# Patient Record
Sex: Male | Born: 1965 | Race: White | Hispanic: No | Marital: Married | Smoking: Current every day smoker
Health system: Southern US, Community
[De-identification: ages and names within clinical notes are randomized; demographics above are authoritative.]

## PROBLEM LIST (undated history)

## (undated) DIAGNOSIS — F17200 Nicotine dependence, unspecified, uncomplicated: Secondary | ICD-10-CM

## (undated) DIAGNOSIS — K219 Gastro-esophageal reflux disease without esophagitis: Secondary | ICD-10-CM

## (undated) DIAGNOSIS — E785 Hyperlipidemia, unspecified: Secondary | ICD-10-CM

## (undated) DIAGNOSIS — T7840XA Allergy, unspecified, initial encounter: Secondary | ICD-10-CM

## (undated) HISTORY — PX: KNEE SURGERY: SHX244

## (undated) HISTORY — PX: EYE SURGERY: SHX253

## (undated) HISTORY — DX: Allergy, unspecified, initial encounter: T78.40XA

## (undated) HISTORY — DX: Hyperlipidemia, unspecified: E78.5

## (undated) HISTORY — DX: Gastro-esophageal reflux disease without esophagitis: K21.9

## (undated) HISTORY — DX: Nicotine dependence, unspecified, uncomplicated: F17.200

---

## 1998-09-14 ENCOUNTER — Ambulatory Visit (HOSPITAL_BASED_OUTPATIENT_CLINIC_OR_DEPARTMENT_OTHER): Admission: RE | Admit: 1998-09-14 | Discharge: 1998-09-14 | Payer: Self-pay | Admitting: Urology

## 1999-10-05 ENCOUNTER — Emergency Department (HOSPITAL_COMMUNITY): Admission: EM | Admit: 1999-10-05 | Discharge: 1999-10-05 | Payer: Self-pay | Admitting: Emergency Medicine

## 1999-10-05 ENCOUNTER — Encounter: Payer: Self-pay | Admitting: Emergency Medicine

## 2004-03-29 ENCOUNTER — Ambulatory Visit (HOSPITAL_COMMUNITY): Admission: RE | Admit: 2004-03-29 | Discharge: 2004-03-29 | Payer: Self-pay | Admitting: General Surgery

## 2004-11-13 ENCOUNTER — Emergency Department (HOSPITAL_COMMUNITY): Admission: EM | Admit: 2004-11-13 | Discharge: 2004-11-13 | Payer: Self-pay | Admitting: Emergency Medicine

## 2006-03-23 IMAGING — CR DG CHEST 2V
2 series · 2 of 2 positions shown · non-contrast
Comparison: none

CLINICAL DATA: 39 year-old male with chest pain
 [id number] VIEWS:

[view not recorded (1 of 2)]
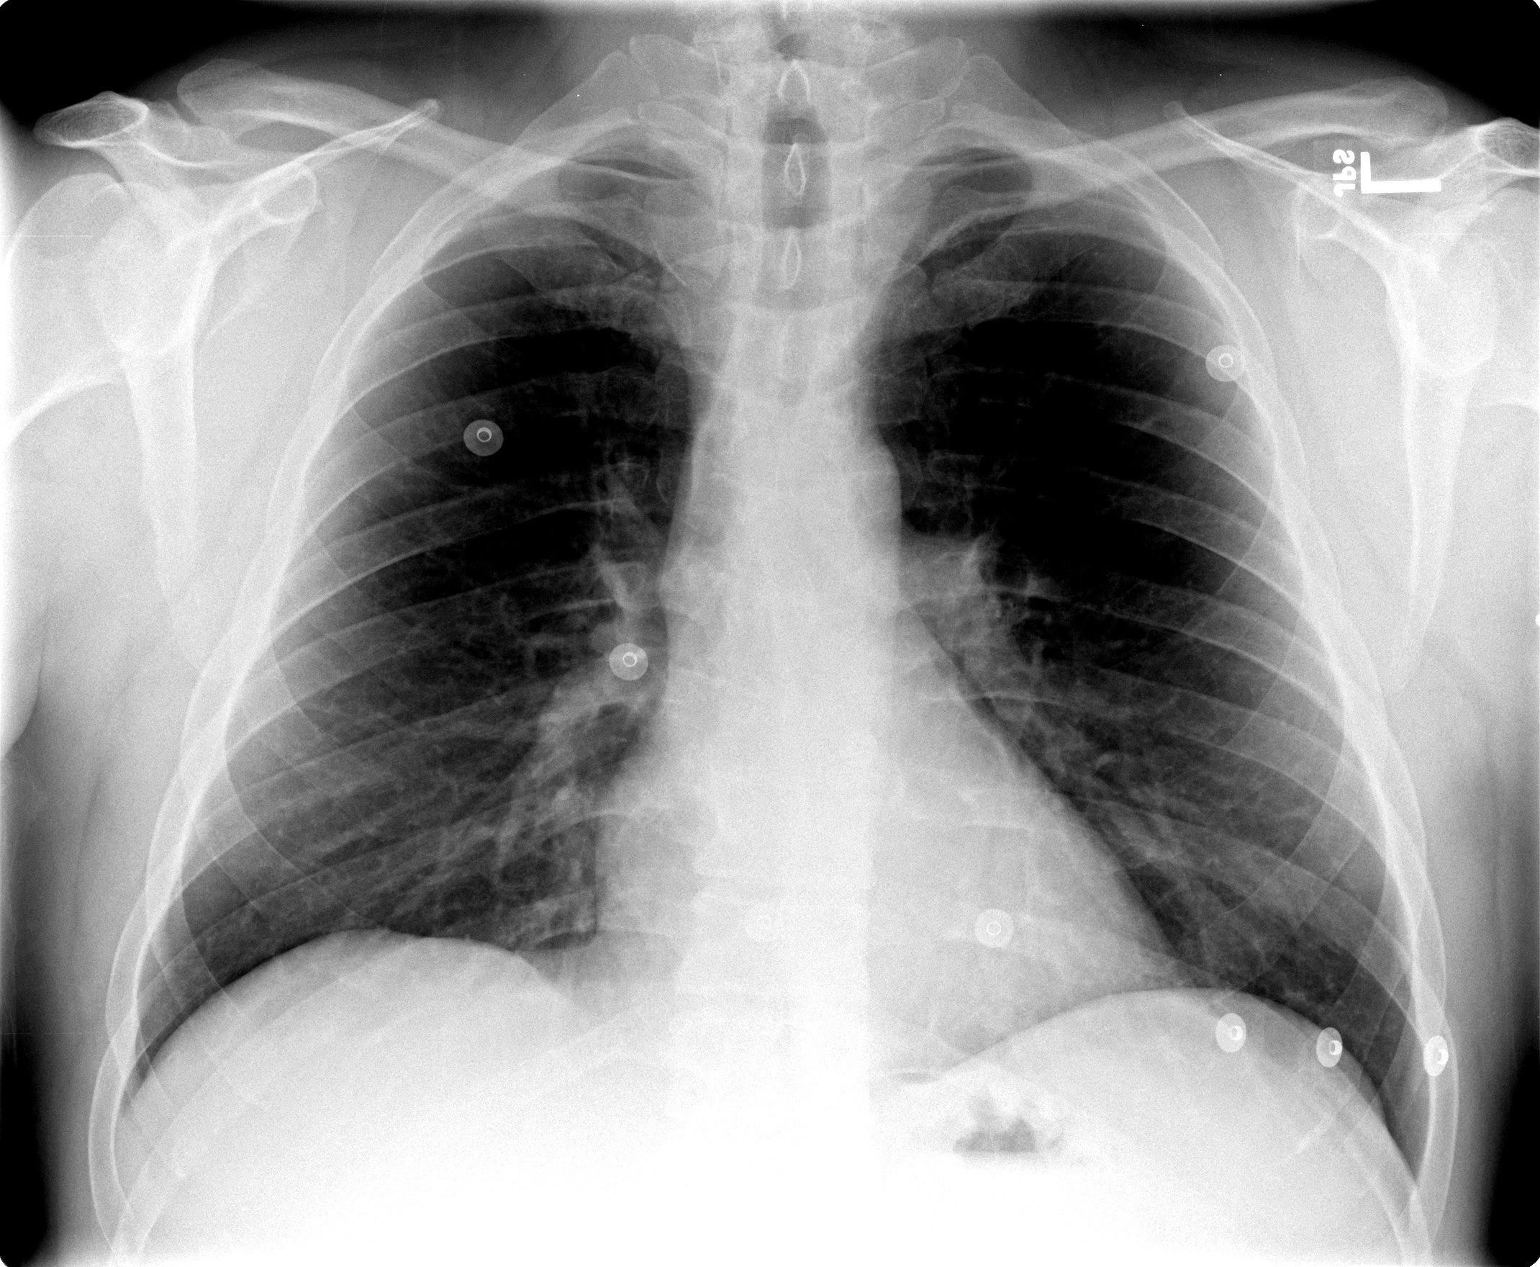

[view not recorded (2 of 2)]
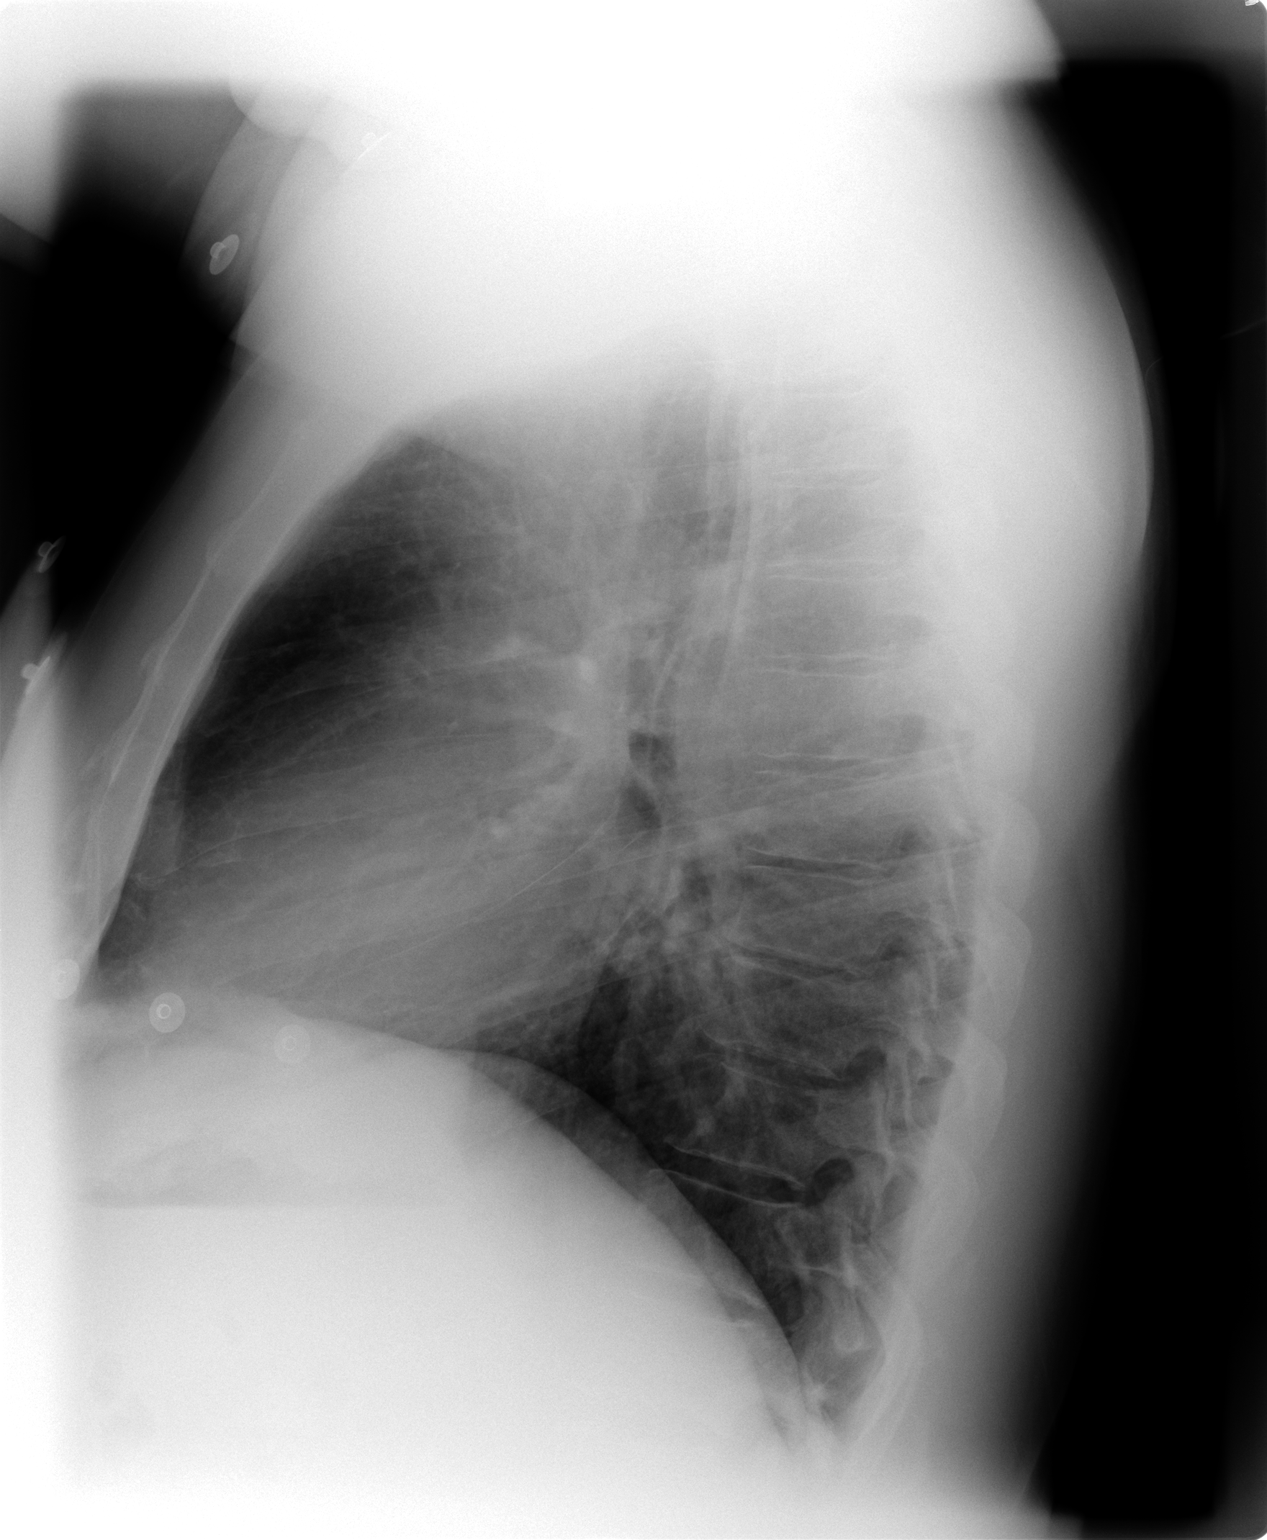

[2 of 2 positions shown; findings below may reference images not displayed]

FINDINGS: Lungs are clear.  No acute pneumonia, consolidation, effusion, or pneumothorax.  Heart size is normal.
IMPRESSION: No active chest disease.

## 2007-06-18 ENCOUNTER — Ambulatory Visit: Payer: Self-pay | Admitting: Family Medicine

## 2007-09-27 ENCOUNTER — Ambulatory Visit: Payer: Self-pay | Admitting: Family Medicine

## 2007-09-29 ENCOUNTER — Ambulatory Visit: Payer: Self-pay | Admitting: Family Medicine

## 2007-10-01 ENCOUNTER — Ambulatory Visit: Payer: Self-pay | Admitting: Family Medicine

## 2010-03-01 ENCOUNTER — Ambulatory Visit: Payer: Self-pay | Admitting: Family Medicine

## 2010-12-05 ENCOUNTER — Ambulatory Visit: Payer: Self-pay | Admitting: Family Medicine

## 2011-01-10 NOTE — Op Note (Signed)
NAME:  Dennis Fox, Dennis Fox                          ACCOUNT NO.:  [id number]   MEDICAL RECORD NO.:  [id number]                   PATIENT TYPE:  AMB   LOCATION:  DAY                                  FACILITY:  [facility]   PHYSICIAN:  Timothy E. Earlene Plater, M.D.              DATE OF BIRTH:  10/04/1965   DATE OF PROCEDURE:  03/29/2004  DATE OF DISCHARGE:                                 OPERATIVE REPORT   PREOPERATIVE DIAGNOSIS:  Umbilical hernia.   POSTOPERATIVE DIAGNOSIS:  Incarcerated umbilical hernia.   OPERATIVE PROCEDURE:  Repair of hernia with mesh.   SURGEON:  Timothy E. Earlene Plater, M.D.   ANESTHESIA:  General.   Mr. Macaraeg is otherwise healthy, works strenuously, has an increasingly  large and painful umbilical hernia, and he wishes to have a repair.  This  has been carefully discussed.  He was seen, identified, and the permit  signed.   The patient taken to the operating room, placed supine, general endotracheal  anesthesia administered.  The abdomen had been shaved, scrubbed, it was now  prepped and draped.  Marcaine 0.25% with epinephrine used throughout.  A  curvilinear incision made in the infraumbilical fold, the umbilical skin  carefully dissected superiorly, and the incarcerated contents of the hernia  dissected off the skin.  Retractors applied.  The hernia appeared to be only  preperitoneal fat, but it was well adherent to the skin and the lateral  fascia of the 2 cm hernia defect.  The preperitoneal fat was carefully  dissected from the skin, the sidewalls of the fascia, above and below the  fascia.  A modified plug was made shorter than usual height and sutured into  the defect, and then a patch was placed over this and sutured to the  surrounding fascia.  This completed the repair.  There was no bleeding or  complication.  The umbilical skin was carefully treated and attached to the  fascia, subcu stitches of 3-0 Vicryl placed, and subcuticular stitches of 3-  0 Monocryl.   Counts correct.  Steri-Strips, dry sterile dressings applied.  He tolerated it well.   Written and verbal instructions given to him and his wife, and he will be  seen and followed as an outpatient.                                               Timothy E. Earlene Plater, M.D.    TED/MEDQ  D:  03/29/2004  T:  03/30/2004  Job:  [id number]

## 2011-05-27 ENCOUNTER — Ambulatory Visit (INDEPENDENT_AMBULATORY_CARE_PROVIDER_SITE_OTHER): Payer: 59 | Admitting: Medical

## 2011-05-27 ENCOUNTER — Encounter: Payer: Self-pay | Admitting: Medical

## 2011-05-27 VITALS — BP 140/80 | HR 68 | Temp 98.1°F | Resp 18 | Ht 67.0 in | Wt 222.0 lb

## 2011-05-27 DIAGNOSIS — J40 Bronchitis, not specified as acute or chronic: Secondary | ICD-10-CM

## 2011-05-27 DIAGNOSIS — J329 Chronic sinusitis, unspecified: Secondary | ICD-10-CM

## 2011-05-27 DIAGNOSIS — F17200 Nicotine dependence, unspecified, uncomplicated: Secondary | ICD-10-CM | POA: Insufficient documentation

## 2011-05-27 MED ORDER — DOXYCYCLINE HYCLATE 100 MG PO TABS: 100.0000 mg | ORAL_TABLET | Freq: Two times a day (BID) | ORAL | Status: AC

## 2011-05-27 MED ORDER — BENZONATATE 100 MG PO CAPS: 100.0000 mg | ORAL_CAPSULE | Freq: Four times a day (QID) | ORAL | Status: DC | PRN

## 2011-05-27 NOTE — Progress Notes (Signed)
Subjective:     Dennis Fox is a 45 y.o. male who presents for 6 day hx/o cough, sinus pressure, chest congestion, bright green sinus drainage, fatigue, and not feeling well.  Usually is not sick.  Been using Coricidin OTC, with some relief. Denies sick contacts.  No other aggravating or relieving factors.  No other c/o.  He is a 1.5 ppd smoker, and has failed Chantix prior.  The following portions of the patient's history were reviewed and updated as appropriate: allergies, current medications, past family history, past medical history, past social history, past surgical history and problem list.  Past Medical History  Diagnosis Date  . Allergy   . GERD (gastroesophageal reflux disease)   . Dyslipidemia   . Tobacco use disorder    Review of Systems Constitutional: +chills; denies fever, sweats, anorexia Skin: denies rash HEENT: +sore throat; denies ear pain, itchy watery eyes Cardiovascular: denies chest pain, palpitations Lungs: +mild SOB, +productive sputum; denies wheezing, hemoptysis, orthopnea, PND Abdomen: denies abdominal pain, nausea, vomiting, diarrhea GU: denies dysuria Extremities: denies edema, myalgias, arthralgias  Objective:   Filed Vitals:   05/27/11 1604  BP: 140/80  Pulse: 68  Temp: 98.1 F (36.7 C)  Resp: 18    General appearance: Alert, WD/WN, no distress, ill appearing                             Skin: warm, no rash, no diaphoresis                           Head: mild sinus tenderness                            Eyes: conjunctiva normal, corneas clear, PERRLA                            Ears: pearly TMs, external ear canals normal                          Nose: septum midline, turbinates swollen, with erythema and clear discharge             Mouth/throat: MMM, tongue normal, moderate pharyngeal erythema                           Neck: supple, no adenopathy, no thyromegaly, nontender                          Heart: RRR, normal S1, S2, no murmurs                     Lungs: +bronchial breath sounds, +scattered rhonchi, +few wheezes, no rales                Extremities: no edema, nontender     Assessment:   Encounter Diagnoses  Name Primary?  . Bronchitis Yes  . Tobacco use disorder   . Sinusitis     Plan:   Prescription given today for Doxycycline and Tessalon Perles as below.  Discussed diagnosis and treatment of bronchitis.  Suggested symptomatic OTC remedies for cough and congestion.  Nasal saline spray for nasal congestion.  Tylenol or Ibuprofen OTC for fever and malaise.  Call/return in 2-3 days if  symptoms are worse or not improving.  Advised that cough may linger even after the infection is improved.     Discussed risks of tobacco use disorder, advised he call 1-800-QUIT-NOW to help with smoking cessation.

## 2012-01-12 ENCOUNTER — Encounter: Payer: Self-pay | Admitting: Medical

## 2012-01-12 ENCOUNTER — Ambulatory Visit (INDEPENDENT_AMBULATORY_CARE_PROVIDER_SITE_OTHER): Payer: 59 | Admitting: Medical

## 2012-01-12 VITALS — BP 120/80 | HR 68 | Temp 97.6°F | Resp 16 | Wt 223.0 lb

## 2012-01-12 DIAGNOSIS — B86 Scabies: Secondary | ICD-10-CM

## 2012-01-12 MED ORDER — HYDROXYZINE HCL 25 MG PO TABS: 25.0000 mg | ORAL_TABLET | Freq: Three times a day (TID) | ORAL | Status: AC | PRN

## 2012-01-12 MED ORDER — PERMETHRIN 5 % EX CREA: TOPICAL_CREAM | CUTANEOUS | Status: DC

## 2012-01-12 NOTE — Progress Notes (Signed)
Subjective: Here for possible scabies.  [place].  His daughter took the cat in, and since then she began having rash and breaking out.  They took her to the dermatologist who diagnosed her with scabies.  She was prescribed a topical cream she kept on overnight as well as a pill.  Her rash cleared up but now he and his wife both have similar itchy rash on legs and lower extremities.  He is taking nothing for symptoms but reports bites on legs, waistline and back of legs in the folds of the knees.    Objective: Gen: wd, wn, nad Skin: scattered small 1mm papules with some surrounding erythema mildly on upper inguinal/pubic region as well as bilat upper and lower legs and popliteal regions.  Assessment and plan Encounter Diagnosis  Name Primary?  . Scabies Yes   Scripts for permethrin and hydroxyzine as below.  Call if not clearing in 1 wk.  Clean the bed sheets and all household contacts should be treated.  The cat has been put outside already.

## 2012-02-17 ENCOUNTER — Encounter: Payer: Self-pay | Admitting: Internal Medicine

## 2012-02-20 ENCOUNTER — Ambulatory Visit (INDEPENDENT_AMBULATORY_CARE_PROVIDER_SITE_OTHER): Payer: 59 | Admitting: Family Medicine

## 2012-02-20 ENCOUNTER — Encounter: Payer: Self-pay | Admitting: Family Medicine

## 2012-02-20 VITALS — BP 104/60 | HR 60 | Ht 68.0 in | Wt 223.0 lb

## 2012-02-20 DIAGNOSIS — Z0000 Encounter for general adult medical examination without abnormal findings: Secondary | ICD-10-CM

## 2012-02-20 DIAGNOSIS — E785 Hyperlipidemia, unspecified: Secondary | ICD-10-CM

## 2012-02-20 DIAGNOSIS — J309 Allergic rhinitis, unspecified: Secondary | ICD-10-CM

## 2012-02-20 DIAGNOSIS — J302 Other seasonal allergic rhinitis: Secondary | ICD-10-CM | POA: Insufficient documentation

## 2012-02-20 DIAGNOSIS — K219 Gastro-esophageal reflux disease without esophagitis: Secondary | ICD-10-CM | POA: Insufficient documentation

## 2012-02-20 LAB — HEMOCCULT GUIAC POC 1CARD (OFFICE)

## 2012-02-20 LAB — COMPREHENSIVE METABOLIC PANEL
ALT: 26 U/L (ref 0–53)
AST: 23 U/L (ref 0–37)
Albumin: 4.3 g/dL (ref 3.5–5.2)
Alkaline Phosphatase: 60 U/L (ref 39–117)
BUN: 20 mg/dL (ref 6–23)
CO2: 23 mEq/L (ref 19–32)
Calcium: 9.5 mg/dL (ref 8.4–10.5)
Chloride: 106 mEq/L (ref 96–112)
Creat: 1.27 mg/dL (ref 0.50–1.35)
Glucose, Bld: 103 mg/dL — ABNORMAL HIGH (ref 70–99)
Potassium: 4.5 mEq/L (ref 3.5–5.3)
Sodium: 137 mEq/L (ref 135–145)
Total Bilirubin: 0.8 mg/dL (ref 0.3–1.2)
Total Protein: 7 g/dL (ref 6.0–8.3)

## 2012-02-20 LAB — LIPID PANEL
Cholesterol: 212 mg/dL — ABNORMAL HIGH (ref 0–200)
HDL: 43 mg/dL (ref >39)
LDL Cholesterol: 125 mg/dL — ABNORMAL HIGH (ref 0–99)
Total CHOL/HDL Ratio: 4.9 Ratio
Triglycerides: 221 mg/dL — ABNORMAL HIGH (ref <150)
VLDL: 44 mg/dL — ABNORMAL HIGH (ref 0–40)

## 2012-02-20 NOTE — Progress Notes (Signed)
  Subjective:    Patient ID: Dennis Fox, male    DOB: 08-22-1966, 46 y.o.   MRN: [id number]  HPI He is here for complete examination. He has no particular concerns. His allergies seem to be under good control. He does have reflux disease and uses TUMS to help with this. He has a previous history of hyperlipidemia. Family history significant for both parents having bypass grafting done in their 30s. His social and family history were reviewed. He does have 2 stepchildren ages 66 and 88. His marriage seems to be going well. Work keeps him busy. He recently quit smoking.   Review of Systems  Constitutional: Negative.   HENT: Negative.   Eyes: Negative.   Respiratory: Negative.   Cardiovascular: Negative.   Gastrointestinal: Negative.   Genitourinary: Negative.   Musculoskeletal: Negative.   Skin: Negative.   Neurological: Negative.   Hematological: Negative.   Psychiatric/Behavioral: Negative.        Objective:   Physical Exam BP 104/60  Pulse 60  Ht 5\' 8"  (1.727 m)  Wt 223 lb (101.152 kg)  BMI 33.91 kg/m2  SpO2 98%  General Appearance:    Alert, cooperative, no distress, appears stated age  Head:    Normocephalic, without obvious abnormality, atraumatic  Eyes:    PERRL, conjunctiva/corneas clear, EOM's intact, fundi    benign  Ears:    Normal TM's and external ear canals  Nose:   Nares normal, mucosa normal, no drainage or sinus   tenderness  Throat:   Lips, mucosa, and tongue normal; teeth and gums normal  Neck:   Supple, no lymphadenopathy;  thyroid:  no   enlargement/tenderness/nodules; no carotid   bruit or JVD  Back:    Spine nontender, no curvature, ROM normal, no CVA     tenderness  Lungs:     Clear to auscultation bilaterally without wheezes, rales or     ronchi; respirations unlabored  Chest Wall:    No tenderness or deformity   Heart:    Regular rate and rhythm, S1 and S2 normal, no murmur, rub   or gallop  Breast Exam:    No chest wall tenderness, masses or  gynecomastia  Abdomen:     Soft, non-tender, nondistended, normoactive bowel sounds,    no masses, no hepatosplenomegaly  Genitalia:    Normal male external genitalia without lesions.  Testicles without masses.  No inguinal hernias.  Rectal:    Normal sphincter tone, no masses or tenderness; guaiac negative stool.  Prostate smooth, no nodules, not enlarged.  Extremities:   No clubbing, cyanosis or edema  Pulses:   2+ and symmetric all extremities  Skin:   Skin color, texture, turgor normal, no rashes or lesions  Lymph nodes:   Cervical, supraclavicular, and axillary nodes normal  Neurologic:   CNII-XII intact, normal strength, sensation and gait; reflexes 2+ and symmetric throughout          Psych:   Normal mood, affect, hygiene and grooming.           Assessment & Plan:   1. Routine general medical examination at a health care facility  CBC with Differential, Comprehensive metabolic panel, Lipid panel, Hemoccult - 1 Card (office)  2. Allergic rhinitis, seasonal    3. GERD (gastroesophageal reflux disease)    4. Hyperlipidemia LDL goal <70  Lipid panel

## 2012-02-20 NOTE — Patient Instructions (Signed)
Congratulations on quitting smoking. If you run into trouble, don't hesitate to call.

## 2012-02-21 LAB — CBC WITH DIFFERENTIAL/PLATELET
Basophils Absolute: 0 10*3/uL (ref 0.0–0.1)
Basophils Relative: 1 % (ref 0–1)
Eosinophils Absolute: 0.3 10*3/uL (ref 0.0–0.7)
Eosinophils Relative: 5 % (ref 0–5)
HCT: 40.4 % (ref 39.0–52.0)
Hemoglobin: 14.2 g/dL (ref 13.0–17.0)
Lymphocytes Relative: 26 % (ref 12–46)
Lymphs Abs: 1.7 10*3/uL (ref 0.7–4.0)
MCH: 31.8 pg (ref 26.0–34.0)
MCHC: 35.1 g/dL (ref 30.0–36.0)
MCV: 90.4 fL (ref 78.0–100.0)
Monocytes Absolute: 0.4 10*3/uL (ref 0.1–1.0)
Monocytes Relative: 7 % (ref 3–12)
Neutro Abs: 3.9 10*3/uL (ref 1.7–7.7)
Neutrophils Relative %: 61 % (ref 43–77)
Platelets: 235 10*3/uL (ref 150–400)
RBC: 4.47 MIL/uL (ref 4.22–5.81)
RDW: 13.5 % (ref 11.5–15.5)
WBC: 6.3 10*3/uL (ref 4.0–10.5)

## 2013-07-28 ENCOUNTER — Telehealth: Payer: Self-pay | Admitting: Internal Medicine

## 2013-07-28 NOTE — Telephone Encounter (Signed)
Pt fell on Saturday and think he bruised his upper rib and possibly torn something and he is at [facility] [place] at [facility] now because its close to his work and wants a Chest Xray done but needs an order to do that. Please advise pt.

## 2013-07-28 NOTE — Telephone Encounter (Signed)
He needs to be evaluated.

## 2013-07-28 NOTE — Telephone Encounter (Signed)
Pt notified and was not happy when i told him that he had to be seen to get his referral for an xray.

## 2015-09-26 ENCOUNTER — Encounter: Payer: Self-pay | Admitting: Medical

## 2015-09-26 ENCOUNTER — Ambulatory Visit (INDEPENDENT_AMBULATORY_CARE_PROVIDER_SITE_OTHER): Payer: BLUE CROSS/BLUE SHIELD | Admitting: Medical

## 2015-09-26 VITALS — BP 140/82 | HR 66 | Temp 97.4°F | Wt 217.0 lb

## 2015-09-26 DIAGNOSIS — J209 Acute bronchitis, unspecified: Secondary | ICD-10-CM | POA: Diagnosis not present

## 2015-09-26 DIAGNOSIS — R059 Cough, unspecified: Secondary | ICD-10-CM

## 2015-09-26 DIAGNOSIS — R05 Cough: Secondary | ICD-10-CM | POA: Diagnosis not present

## 2015-09-26 MED ORDER — AZITHROMYCIN 250 MG PO TABS: ORAL_TABLET | ORAL | Status: DC

## 2015-09-26 MED ORDER — BENZONATATE 200 MG PO CAPS: 200.0000 mg | ORAL_CAPSULE | Freq: Two times a day (BID) | ORAL | Status: DC | PRN

## 2015-09-26 MED ORDER — ALBUTEROL SULFATE HFA 108 (90 BASE) MCG/ACT IN AERS: 2.0000 | INHALATION_SPRAY | Freq: Four times a day (QID) | RESPIRATORY_TRACT | Status: DC | PRN

## 2015-09-26 NOTE — Progress Notes (Signed)
Subjective: Chief Complaint  Patient presents with  . cold?    going on for about a month. has gotten rid of everything except the congestion.    Here as a returning patient. Use to see Dr. Lalonde prior.  Otherwise has beSusann Givensn good health.  Got sick on 09/01/15, started with cold, congestion, feeling bad.  Had runny nose, malaise.  Overall symptoms have improved but can't seem to get rid of chest congestion.  Is a smoker.  Has cut down on smoking, cut way back.   Rarely gets sick.  This just lingered a bit longer than he thought is should.  Has felt some wheezing.  Been smoker for 25 years.  Works with [employer], in and outside often, going back and forth in and out of building.  Wife had similar illness recently.   Also had sick contact at work.  No other aggravating or relieving factors. No other complaint.  Past Medical History  Diagnosis Date  . Allergy   . GERD (gastroesophageal reflux disease)   . Dyslipidemia   . Tobacco use disorder    ROS as in subjective   Objective: BP 140/82 mmHg  Pulse 66  Temp(Src) 97.4 F (36.3 C) (Tympanic)  Wt 217 lb (98.431 kg)  General appearance: Alert, WD/WN, no distress, ill appearing                             Skin: warm, no rash, no diaphoresis                           Head: no sinus tenderness                            Eyes: conjunctiva normal, corneas clear, PERRLA                            Ears: pearly TMs, external ear canals normal                          Nose: septum midline, turbinates swollen, with erythema and clear discharge             Mouth/throat: MMM, tongue normal, mild pharyngeal erythema                           Neck: supple, no adenopathy, no thyromegaly, non tender                          Heart: RRR, normal S1, S2, no murmurs                         Lungs: +bronchial breath sounds, +scattered rhonchi, no wheezes, no rales                Extremities: no edema, non tender       Assessment: Encounter  Diagnosis  Name Primary?  . Acute bronchitis, unspecified organism Yes    Plan: Discussed concerns, symptoms, begin zpak, discussed use of albuterol, and can use Tessalon if needed.   Rest, hydrate well, and recheck if not resolving.     Angelica was seen today for cold?.  Diagnoses and all orders for this visit:  Acute  bronchitis, unspecified organism  Cough  Other orders -     albuterol (PROVENTIL HFA;VENTOLIN HFA) 108 (90 Base) MCG/ACT inhaler; Inhale 2 puffs into the lungs every 6 (six) hours as needed for wheezing or shortness of breath. -     azithromycin (ZITHROMAX) 250 MG tablet; 2 tablets day 1, then 1 tablet days 2-4 -     benzonatate (TESSALON) 200 MG capsule; Take 1 capsule (200 mg total) by mouth 2 (two) times daily as needed for cough.

## 2015-10-11 ENCOUNTER — Ambulatory Visit (INDEPENDENT_AMBULATORY_CARE_PROVIDER_SITE_OTHER): Payer: BLUE CROSS/BLUE SHIELD | Admitting: Family Medicine

## 2015-10-11 ENCOUNTER — Encounter: Payer: Self-pay | Admitting: Family Medicine

## 2015-10-11 VITALS — BP 140/84 | HR 60

## 2015-10-11 DIAGNOSIS — M545 Low back pain, unspecified: Secondary | ICD-10-CM

## 2015-10-11 MED ORDER — CYCLOBENZAPRINE HCL 10 MG PO TABS: 10.0000 mg | ORAL_TABLET | Freq: Three times a day (TID) | ORAL | Status: DC | PRN

## 2015-10-11 MED ORDER — NAPROXEN SODIUM 220 MG PO TABS: 220.0000 mg | ORAL_TABLET | Freq: Two times a day (BID) | ORAL | Status: DC

## 2015-10-11 NOTE — Progress Notes (Signed)
   Subjective:    Patient ID: Dennis Fox, male    DOB: [birth year], 49 y.o.   MRN: [id number]  HPI Chief Complaint  Patient presents with  . back pain    sciatic nerve pain. felt it coming on.    He is here for right low back pain that started hurting 2 weeks ago after playing golf and is progressively getting worse. She states he used a massage chair last night and fell asleep for a while in it and when he woke up he was in "terrible pain". States he has had similar pain in past and was told he had "sciatica".  Pain is non radiating and worse when going from sitting to standing and loosens up with ambulation somewhat. Heat is helping. Reports he has taken aleve over past 2 weeks but took nothing today. States Aleve is not helping.  Denies fever, chills, urinary or bowel incontinence or urinary retention. No numbness, tingling or weakness to lower extremities.  No other joint pain. No history of chronic steroid use.    Review of Systems Pertinent positives and negatives in the history of present illness.     Objective:   Physical Exam  Constitutional: He is oriented to person, place, and time. He appears well-developed and well-nourished. No distress.  Neck: Normal range of motion. Neck supple.  Musculoskeletal:       Lumbar back: He exhibits decreased range of motion, tenderness, pain and spasm.       Back:  No erythema, rash. Tenderness over right SI joint. Spasm to right low back. Pain with flexion, limited ROM. Negative straight leg raise. Gait abnormality noticed.   Neurological: He is alert and oriented to person, place, and time. He has normal strength and normal reflexes. No sensory deficit. Gait abnormal.  Skin: Skin is warm and dry. No rash noted. No pallor.   BP 140/84 mmHg  Pulse 60      Assessment & Plan:  Right-sided low back pain without sciatica - Plan: cyclobenzaprine (FLEXERIL) 10 MG tablet, naproxen sodium (ALEVE) 220 MG tablet  Discussed that he appears to  have sacroiliac joint pain and possible spasm. No signs of infection or systemic illness. Recommend heat to area 20 minutes at a time 2-3 times daily, take 2 aleve twice daily with food, samples of Aleve given. Also prescribed flexeril to use as needed. Discussed that he should notice some improvement in next 2-3 days and if not he should let me know. He would like to be able to play golf next week and I recommend holding off on this for now.

## 2015-10-11 NOTE — Patient Instructions (Signed)
Use heat to the area 2-3 times per day, 20 minutes at a time. Take 2 Aleve with food twice daily for the next week. Use the Flexeril as needed for pain and spasm.  Call/return if no improvement in the next 2-3 days or if you get worse.   Back Pain, Adult Back pain is very common in adults.The cause of back pain is rarely dangerous and the pain often gets better over time.The cause of your back pain may not be known. Some common causes of back pain include:  Strain of the muscles or ligaments supporting the spine.  Wear and tear (degeneration) of the spinal disks.  Arthritis.  Direct injury to the back. For many people, back pain may return. Since back pain is rarely dangerous, most people can learn to manage this condition on their own. HOME CARE INSTRUCTIONS Watch your back pain for any changes. The following actions may help to lessen any discomfort you are feeling:  Remain active. It is stressful on your back to sit or stand in one place for long periods of time. Do not sit, drive, or stand in one place for more than 30 minutes at a time. Take short walks on even surfaces as soon as you are able.Try to increase the length of time you walk each day.  Exercise regularly as directed by your health care provider. Exercise helps your back heal faster. It also helps avoid future injury by keeping your muscles strong and flexible.  Do not stay in bed.Resting more than 1-2 days can delay your recovery.  Pay attention to your body when you bend and lift. The most comfortable positions are those that put less stress on your recovering back. Always use proper lifting techniques, including:  Bending your knees.  Keeping the load close to your body.  Avoiding twisting.  Find a comfortable position to sleep. Use a firm mattress and lie on your side with your knees slightly bent. If you lie on your back, put a pillow under your knees.  Avoid feeling anxious or stressed.Stress increases  muscle tension and can worsen back pain.It is important to recognize when you are anxious or stressed and learn ways to manage it, such as with exercise.  Take medicines only as directed by your health care provider. Over-the-counter medicines to reduce pain and inflammation are often the most helpful.Your health care provider may prescribe muscle relaxant drugs.These medicines help dull your pain so you can more quickly return to your normal activities and healthy exercise.  Apply ice to the injured area:  Put ice in a plastic bag.  Place a towel between your skin and the bag.  Leave the ice on for 20 minutes, 2-3 times a day for the first 2-3 days. After that, ice and heat may be alternated to reduce pain and spasms.  Maintain a healthy weight. Excess weight puts extra stress on your back and makes it difficult to maintain good posture. SEEK MEDICAL CARE IF:  You have pain that is not relieved with rest or medicine.  You have increasing pain going down into the legs or buttocks.  You have pain that does not improve in one week.  You have night pain.  You lose weight.  You have a fever or chills. SEEK IMMEDIATE MEDICAL CARE IF:   You develop new bowel or bladder control problems.  You have unusual weakness or numbness in your arms or legs.  You develop nausea or vomiting.  You develop abdominal pain.  You feel faint.   This information is not intended to replace advice given to you by your health care provider. Make sure you discuss any questions you have with your health care provider.   Document Released: 08/11/2005 Document Revised: 09/01/2014 Document Reviewed: 12/13/2013 Elsevier Interactive Patient Education [employer].

## 2017-01-15 ENCOUNTER — Encounter: Payer: Self-pay | Admitting: Family Medicine

## 2017-01-15 ENCOUNTER — Ambulatory Visit (INDEPENDENT_AMBULATORY_CARE_PROVIDER_SITE_OTHER): Payer: BLUE CROSS/BLUE SHIELD | Admitting: Family Medicine

## 2017-01-15 VITALS — BP 128/78 | HR 54 | Temp 98.0°F | Resp 16 | Wt 231.8 lb

## 2017-01-15 DIAGNOSIS — J302 Other seasonal allergic rhinitis: Secondary | ICD-10-CM

## 2017-01-15 DIAGNOSIS — J0140 Acute pansinusitis, unspecified: Secondary | ICD-10-CM | POA: Diagnosis not present

## 2017-01-15 DIAGNOSIS — F17200 Nicotine dependence, unspecified, uncomplicated: Secondary | ICD-10-CM

## 2017-01-15 MED ORDER — AMOXICILLIN 875 MG PO TABS: 875.0000 mg | ORAL_TABLET | Freq: Two times a day (BID) | ORAL | 0 refills | Status: DC

## 2017-01-15 NOTE — Progress Notes (Signed)
Subjective: Chief Complaint  Patient presents with  . sick    sick for a week, coughing, phelgm- green, sinus pressure, congestion     Lauretta GrillRandy J Stabenow is a 51 y.o. male who presents for possible sinus infection.  Symptoms include a 2 week history of nasal congestion, sinus pain, rhinorrhea, watery and itchy eyes, post nasal drainage, coughing and wheezing.  States he feels like he is getting worse. Cough is worse in the morning. Denies fever, chills, dizziness, chest pain, palpitations, shortness of breath, orthopnea, abdominal pain, N/V/D.   History of hay fever. No recent infection or antibiotics.   Past history is significant for no history of pneumonia or bronchitis. Patient is a smoker  (1 ppd x 20 yrs).  Using an oral antihistamine for symptoms.  Denies sick contacts.  No other aggravating or relieving factors.  No other c/o.  ROS as in subjective   Objective: Vitals:   01/15/17 1119  BP: 128/78  Pulse: (!) 54  Resp: 16  Temp: 98 F (36.7 C)    General appearance: Alert, WD/WN, no distress                             Skin: warm, no rash                           Head: + frontal, maxillary sinus tenderness,                            Eyes: conjunctiva normal, corneas clear, PERRLA                            Ears: pearly TMs, external ear canals normal                          Nose: septum midline, turbinates swollen, with erythema and clear discharge             Mouth/throat: MMM, tongue normal, mild pharyngeal erythema                           Neck: supple, no adenopathy, no thyromegaly, nontender                          Heart: RRR, normal S1, S2, no murmurs                         Lungs: CTA bilaterally, no wheezes, rales, or rhonchi       Assessment and Plan: Acute non-recurrent pansinusitis  Smoker  Seasonal allergic rhinitis, unspecified trigger   Prescription sent for Amoxicillin. Continue treating allergies with antihistamine. Encouraged him to stop smoking.    Can use OTC Mucinex for congestion.  Tylenol or Ibuprofen OTC for fever and malaise.  Discussed symptomatic relief, nasal saline flush, and call or return if worse or not back to baseline after completing the antibiotic.

## 2017-01-21 DIAGNOSIS — Z85828 Personal history of other malignant neoplasm of skin: Secondary | ICD-10-CM | POA: Diagnosis not present

## 2017-01-21 DIAGNOSIS — X32XXXD Exposure to sunlight, subsequent encounter: Secondary | ICD-10-CM | POA: Diagnosis not present

## 2017-01-21 DIAGNOSIS — L570 Actinic keratosis: Secondary | ICD-10-CM | POA: Diagnosis not present

## 2017-01-21 DIAGNOSIS — Z08 Encounter for follow-up examination after completed treatment for malignant neoplasm: Secondary | ICD-10-CM | POA: Diagnosis not present

## 2017-01-21 DIAGNOSIS — Z1283 Encounter for screening for malignant neoplasm of skin: Secondary | ICD-10-CM | POA: Diagnosis not present

## 2017-02-02 ENCOUNTER — Telehealth: Payer: Self-pay | Admitting: Family Medicine

## 2017-02-02 MED ORDER — AMOXICILLIN 875 MG PO TABS: 875.0000 mg | ORAL_TABLET | Freq: Two times a day (BID) | ORAL | 0 refills | Status: DC

## 2017-02-02 NOTE — Telephone Encounter (Signed)
Ok to refill amoxicillin

## 2017-02-02 NOTE — Telephone Encounter (Signed)
Pt was notified. Pt is about 50% better than he was. He thinks beginning of last week was his last pill and then like Thursday he started with the drainage and green mucous. I advised him we would refill this again but he woul dneed to be seen if not better. Pt is coming in later this month for CPE and will discuss then if not back to normal

## 2017-02-02 NOTE — Telephone Encounter (Signed)
Pt called for 2nd round antibiotics.  Sinus infection not completely cleared up and would like a second round of antibiotics.  I advised pt if not clear after 2nd round that he would need to be seen.  [facility] [address]

## 2017-02-02 NOTE — Telephone Encounter (Signed)
done

## 2017-02-02 NOTE — Telephone Encounter (Signed)
Please call and find out when he took his last dose of Amoxicillin and what percentage better he is. Let him know we will send in another round but depending on these answers as to which antibiotic would be appropriate. If he is not back to baseline after completing the second round then he will need to be seen.

## 2017-02-20 ENCOUNTER — Encounter: Payer: BLUE CROSS/BLUE SHIELD | Admitting: Medical

## 2017-04-03 ENCOUNTER — Encounter: Payer: Self-pay | Admitting: Medical

## 2017-04-03 ENCOUNTER — Ambulatory Visit (INDEPENDENT_AMBULATORY_CARE_PROVIDER_SITE_OTHER): Payer: BLUE CROSS/BLUE SHIELD | Admitting: Medical

## 2017-04-03 VITALS — BP 124/82 | HR 57 | Ht 67.0 in | Wt 222.8 lb

## 2017-04-03 DIAGNOSIS — E785 Hyperlipidemia, unspecified: Secondary | ICD-10-CM | POA: Diagnosis not present

## 2017-04-03 DIAGNOSIS — F17200 Nicotine dependence, unspecified, uncomplicated: Secondary | ICD-10-CM | POA: Diagnosis not present

## 2017-04-03 DIAGNOSIS — K219 Gastro-esophageal reflux disease without esophagitis: Secondary | ICD-10-CM | POA: Diagnosis not present

## 2017-04-03 DIAGNOSIS — Z122 Encounter for screening for malignant neoplasm of respiratory organs: Secondary | ICD-10-CM | POA: Diagnosis not present

## 2017-04-03 DIAGNOSIS — J302 Other seasonal allergic rhinitis: Secondary | ICD-10-CM

## 2017-04-03 DIAGNOSIS — Z1211 Encounter for screening for malignant neoplasm of colon: Secondary | ICD-10-CM | POA: Diagnosis not present

## 2017-04-03 DIAGNOSIS — Z0000 Encounter for general adult medical examination without abnormal findings: Secondary | ICD-10-CM

## 2017-04-03 DIAGNOSIS — Z125 Encounter for screening for malignant neoplasm of prostate: Secondary | ICD-10-CM | POA: Diagnosis not present

## 2017-04-03 DIAGNOSIS — Z136 Encounter for screening for cardiovascular disorders: Secondary | ICD-10-CM

## 2017-04-03 DIAGNOSIS — Z8042 Family history of malignant neoplasm of prostate: Secondary | ICD-10-CM

## 2017-04-03 LAB — COMPREHENSIVE METABOLIC PANEL
ALK PHOS: 61 U/L (ref 40–115)
ALT: 25 U/L (ref 9–46)
AST: 21 U/L (ref 10–35)
Albumin: 4.5 g/dL (ref 3.6–5.1)
BUN: 14 mg/dL (ref 7–25)
CALCIUM: 9.6 mg/dL (ref 8.6–10.3)
CO2: 24 mmol/L (ref 20–32)
Chloride: 102 mmol/L (ref 98–110)
Creat: 0.99 mg/dL (ref 0.70–1.33)
GLUCOSE: 92 mg/dL (ref 65–99)
POTASSIUM: 4.4 mmol/L (ref 3.5–5.3)
Sodium: 136 mmol/L (ref 135–146)
TOTAL PROTEIN: 7.2 g/dL (ref 6.1–8.1)
Total Bilirubin: 1.2 mg/dL (ref 0.2–1.2)

## 2017-04-03 LAB — POCT URINALYSIS DIP (PROADVANTAGE DEVICE)
BILIRUBIN UA: NEGATIVE
BILIRUBIN UA: NEGATIVE mg/dL
Blood, UA: NEGATIVE
Glucose, UA: NEGATIVE mg/dL
LEUKOCYTES UA: NEGATIVE
Nitrite, UA: NEGATIVE
PH UA: 6 (ref 5.0–8.0)
Protein Ur, POC: NEGATIVE mg/dL
Specific Gravity, Urine: 1.005
Urobilinogen, Ur: NEGATIVE

## 2017-04-03 LAB — CBC
HEMATOCRIT: 45.4 % (ref 38.5–50.0)
Hemoglobin: 15.8 g/dL (ref 13.2–17.1)
MCH: 33.4 pg — ABNORMAL HIGH (ref 27.0–33.0)
MCHC: 34.8 g/dL (ref 32.0–36.0)
MCV: 96 fL (ref 80.0–100.0)
MPV: 10.7 fL (ref 7.5–12.5)
Platelets: 219 10*3/uL (ref 140–400)
RBC: 4.73 MIL/uL (ref 4.20–5.80)
RDW: 13.1 % (ref 11.0–15.0)
WBC: 7.3 10*3/uL (ref 4.0–10.5)

## 2017-04-03 LAB — LIPID PANEL
CHOL/HDL RATIO: 4.8 ratio (ref <5.0)
Cholesterol: 220 mg/dL — ABNORMAL HIGH (ref <200)
HDL: 46 mg/dL (ref >40)
LDL Cholesterol: 142 mg/dL — ABNORMAL HIGH (ref <100)
Triglycerides: 159 mg/dL — ABNORMAL HIGH (ref <150)
VLDL: 32 mg/dL — ABNORMAL HIGH (ref <30)

## 2017-04-03 NOTE — Patient Instructions (Signed)
Recommendations:  See your eye doctor yearly for routine vision care.  See your dentist yearly for routine dental care including hygiene visits twice yearly.  See dermatology yearly  We are referring you for your first screening colonoscopy  We are checking prostate screen today  Call insurance to see if they cover the following:  Shingrix vaccine  Prevnar 13 pneumonia vaccine  Pneumococcal 23 pneumonia vaccine  Chest CT low dose lung cancer screen  I strongly encourage you to quit smoking! Cut back on alcohol exercise regularly Work on losing some weight.  Set a goal such as losing 20lb in the next 6 months  See [place] yearly for a physical     Health Maintenance, Male A healthy lifestyle and preventive care is important for your health and wellness. Ask your health care provider about what schedule of regular examinations is right for you. What should I know about weight and diet? Eat a Healthy Diet  Eat plenty of vegetables, fruits, whole grains, low-fat dairy products, and lean protein.  Do not eat a lot of foods high in solid fats, added sugars, or salt.  Maintain a Healthy Weight Regular exercise can help you achieve or maintain a healthy weight. You should:  Do at least 150 minutes of exercise each week. The exercise should increase your heart rate and make you sweat (moderate-intensity exercise).  Do strength-training exercises at least twice a week.  Watch Your Levels of Cholesterol and Blood Lipids  Have your blood tested for lipids and cholesterol every 5 years starting at 51 years of age. If you are at high risk for heart disease, you should start having your blood tested when you are 51 years old. You may need to have your cholesterol levels checked more often if: ? Your lipid or cholesterol levels are high. ? You are older than 51 years of age. ? You are at high risk for heart disease.  What should I know about cancer screening? Many types of cancers  can be detected early and may often be prevented. Lung Cancer  You should be screened every year for lung cancer if: ? You are a current smoker who has smoked for at least 30 years. ? You are a former smoker who has quit within the past 15 years.  Talk to your health care provider about your screening options, when you should start screening, and how often you should be screened.  Colorectal Cancer  Routine colorectal cancer screening usually begins at 52 years of age and should be repeated every 5-10 years until you are 51 years old. You may need to be screened more often if early forms of precancerous polyps or small growths are found. Your health care provider may recommend screening at an earlier age if you have risk factors for colon cancer.  Your health care provider may recommend using home test kits to check for hidden blood in the stool.  A small camera at the end of a tube can be used to examine your colon (sigmoidoscopy or colonoscopy). This checks for the earliest forms of colorectal cancer.  Prostate and Testicular Cancer  Depending on your age and overall health, your health care provider may do certain tests to screen for prostate and testicular cancer.  Talk to your health care provider about any symptoms or concerns you have about testicular or prostate cancer.  Skin Cancer  Check your skin from head to toe regularly.  Tell your health care provider about any new moles or  changes in moles, especially if: ? There is a change in a mole's size, shape, or color. ? You have a mole that is larger than a pencil eraser.  Always use sunscreen. Apply sunscreen liberally and repeat throughout the day.  Protect yourself by wearing long sleeves, pants, a wide-brimmed hat, and sunglasses when outside.  What should I know about heart disease, diabetes, and high blood pressure?  If you are 65-59 years of age, have your blood pressure checked every 3-5 years. If you are 19 years  of age or older, have your blood pressure checked every year. You should have your blood pressure measured twice-once when you are at a hospital or clinic, and once when you are not at a hospital or clinic. Record the average of the two measurements. To check your blood pressure when you are not at a hospital or clinic, you can use: ? An automated blood pressure machine at a pharmacy. ? A home blood pressure monitor.  Talk to your health care provider about your target blood pressure.  If you are between 14-105 years old, ask your health care provider if you should take aspirin to prevent heart disease.  Have regular diabetes screenings by checking your fasting blood sugar level. ? If you are at a normal weight and have a low risk for diabetes, have this test once every three years after the age of 24. ? If you are overweight and have a high risk for diabetes, consider being tested at a younger age or more often.  A one-time screening for abdominal aortic aneurysm (AAA) by ultrasound is recommended for men aged 65-75 years who are current or former smokers. What should I know about preventing infection? Hepatitis B If you have a higher risk for hepatitis B, you should be screened for this virus. Talk with your health care provider to find out if you are at risk for hepatitis B infection. Hepatitis C Blood testing is recommended for:  Everyone born from [birth year] through 1965.  Anyone with known risk factors for hepatitis C.  Sexually Transmitted Diseases (STDs)  You should be screened each year for STDs including gonorrhea and chlamydia if: ? You are sexually active and are younger than 51 years of age. ? You are older than 51 years of age and your health care provider tells you that you are at risk for this type of infection. ? Your sexual activity has changed since you were last screened and you are at an increased risk for chlamydia or gonorrhea. Ask your health care provider if you are at  risk.  Talk with your health care provider about whether you are at high risk of being infected with HIV. Your health care provider may recommend a prescription medicine to help prevent HIV infection.  What else can I do?  Schedule regular health, dental, and eye exams.  Stay current with your vaccines (immunizations).  Do not use any tobacco products, such as cigarettes, chewing tobacco, and e-cigarettes. If you need help quitting, ask your health care provider.  Limit alcohol intake to no more than 2 drinks per day. One drink equals 12 ounces of beer, 5 ounces of wine, or 1 ounces of hard liquor.  Do not use street drugs.  Do not share needles.  Ask your health care provider for help if you need support or information about quitting drugs.  Tell your health care provider if you often feel depressed.  Tell your health care provider if you have ever  been abused or do not feel safe at home. This information is not intended to replace advice given to you by your health care provider. Make sure you discuss any questions you have with your health care provider. Document Released: 02/07/2008 Document Revised: 04/09/2016 Document Reviewed: 05/15/2015 Elsevier Interactive Patient Education  [employer]2018 Elsevier Inc.

## 2017-04-03 NOTE — Progress Notes (Signed)
Subjective:   HPI  Dennis Fox is a 51 y.o. male who presents for physical Chief Complaint  Patient presents with  . Annual Exam    physical ,no other concerns     Medical care team includes: Razan Siler, Kermit Baloavid S, PA-C here for primary care Dentist Eye doctor Dermatology yearly  Prior vaccinations:  TD or Tdap: within 10 years Influenza: usually declines Pneumococcal: none prior Shingles/Zostavax: none prior  Concerns: none  Reviewed their medical, surgical, family, social, medication, and allergy history and updated chart as appropriate.  Past Medical History:  Diagnosis Date  . Allergy   . Dyslipidemia   . GERD (gastroesophageal reflux disease)   . Tobacco use disorder     Past Surgical History:  Procedure Laterality Date  . EYE SURGERY     pterygium  . KNEE SURGERY     age 51yo    Social History   Social History  . Marital status: Married    Spouse name: N/A  . Number of children: N/A  . Years of education: N/A   Occupational History  . Not on file.   Social History Main Topics  . Smoking status: Current Every Day Smoker    Packs/day: 1.00    Years: 30.00    Last attempt to quit: 10/24/2011  . Smokeless tobacco: Never Used  . Alcohol use 13.2 oz/week    1 Standard drinks or equivalent, 21 Cans of beer per week  . Drug use: No  . Sexual activity: Yes   Other Topics Concern  . Not on file   Social History Narrative   Lives with wife, daughter 51yo.  Works for [employer].   Exercise golf 2-3 days per week, active on the job.   Tries to eat healthy.  03/2017.    Family History  Problem Relation Age of Onset  . Heart disease Mother [id number]       CABG  . Cancer Mother        non hodkins lymphoma  . Heart disease Father [id number]       CABG  . COPD Father   . Pneumonia Father        died of PNA  . Diabetes Father   . Cancer Maternal Uncle        uncle  . Cancer Paternal Uncle   . Stroke Neg Hx      Current Outpatient Prescriptions:  .   naproxen sodium (ALEVE) 220 MG tablet, Take 1 tablet (220 mg total) by mouth 2 (two) times daily with a meal., Disp: 18 tablet, Rfl: 0 .  cyclobenzaprine (FLEXERIL) 10 MG tablet, Take 1 tablet (10 mg total) by mouth 3 (three) times daily as needed for muscle spasms. (Patient not taking: Reported on 04/03/2017), Disp: 30 tablet, Rfl: 0  No Known Allergies   Review of Systems Constitutional: -fever, -chills, -sweats, -unexpected weight change, -decreased appetite, -fatigue Allergy: -sneezing, -itching, -congestion Dermatology: -changing moles, --rash, -lumps ENT: -runny nose, -ear pain, -sore throat, -hoarseness, -sinus pain, -teeth pain, - ringing in ears, -hearing loss, -nosebleeds Cardiology: -chest pain, -palpitations, -swelling, -difficulty breathing when lying flat, -waking up short of breath Respiratory: -cough, -shortness of breath, -difficulty breathing with exercise or exertion, -wheezing, -coughing up blood Gastroenterology: -abdominal pain, -nausea, -vomiting, -diarrhea, -constipation, -blood in stool, -changes in bowel movement, -difficulty swallowing or eating Hematology: -bleeding, -bruising  Musculoskeletal: -joint aches, -muscle aches, -joint swelling, -back pain, -neck pain, -cramping, -changes in gait Ophthalmology: denies vision changes, eye redness, itching, discharge  Urology: -burning with urination, -difficulty urinating, -blood in urine, -urinary frequency, -urgency, -incontinence Neurology: -headache, -weakness, -tingling, -numbness, -memory loss, -falls, -dizziness Psychology: -depressed mood, -agitation, -sleep problems     Objective:   BP 124/82   Pulse (!) 57   Ht 5\' 7"  (1.702 m)   Wt 222 lb 12.8 oz (101.1 kg)   SpO2 97%   BMI 34.90 kg/m   Wt Readings from Last 3 Encounters:  04/03/17 222 lb 12.8 oz (101.1 kg)  01/15/17 231 lb 12.8 oz (105.1 kg)  09/26/15 217 lb (98.4 kg)    General appearance: alert, no distress, WD/WN, Caucasian male Skin: scattered  macules, no worrisome lesions HEENT: normocephalic, conjunctiva/corneas normal, sclerae anicteric, PERRLA, EOMi, nares patent, no discharge or erythema, pharynx with mild erythema, inflamed appearing Oral cavity: MMM, tongue normal, teeth normal Neck: supple, no lymphadenopathy, no thyromegaly, no masses, normal ROM, no bruits Chest: non tender, normal shape and expansion Heart: RRR, normal S1, S2, no murmurs Lungs:decrease sounds somewhat, coarse, no wheezes, rhonchi, or rales Abdomen: +bs, soft, non tender, non distended, no masses, no hepatomegaly, no splenomegaly, no bruits Back: non tender, normal ROM, no scoliosis Musculoskeletal: upper extremities non tender, no obvious deformity, normal ROM throughout, lower extremities non tender, no obvious deformity, normal ROM throughout Extremities: no edema, no cyanosis, no clubbing Pulses: 2+ symmetric, upper and lower extremities, normal cap refill Neurological: alert, oriented x 3, CN2-12 intact, strength normal upper extremities and lower extremities, sensation normal throughout, DTRs 2+ throughout, no cerebellar signs, gait normal Psychiatric: normal affect, behavior normal, pleasant  GU: normal male external genitalia,circumcised, nontender, no masses, no hernia, no lymphadenopathy Rectal: anus normal tone, prostate WNL, no nodules   Adult ECG Report  Indication: family hx/o, screen for heart disease, smoker  Rate: 45 bpm  Rhythm: sinus bradycardia  QRS Axis: 51 degrees  PR Interval:  QRS Duration: 88ms  QTc:  Conduction Disturbances: none  Other Abnormalities: none  Patient's cardiac risk factors are: family history of premature cardiovascular disease, male gender, obesity (BMI >= 30 kg/m2) and smoking/ tobacco exposure.  EKG comparison: none  Narrative Interpretation: marked sinus bradycardia     Assessment and Plan :    Encounter Diagnoses  Name Primary?  . Annual physical exam Yes  . Seasonal allergic  rhinitis, unspecified trigger   . Gastroesophageal reflux disease, esophagitis presence not specified   . Hyperlipidemia LDL goal <70   . Tobacco use disorder   . Screening for prostate cancer   . Screen for colon cancer   . Encounter for screening for lung cancer   . Family history of prostate cancer     Physical exam - discussed and counseled on healthy lifestyle, diet, exercise, preventative care, vaccinations, sick and well care, proper use of emergency dept and after hours care, and addressed their concerns.    Health screening: See your eye doctor yearly for routine vision care. See your dentist yearly for routine dental care including hygiene visits twice yearly. See dermatology yearly  Cancer screening Referral for first colonoscopy Discussed PSA, prostate exam, and prostate cancer screening risks/benefits.   Discussed prostate symptoms as well.  Prostate screening performed: Yes  Vaccinations: Counseled on the following vaccines:  influenza, pneumococcal and Shingrix He reports Td <10 years ago  Acute issues discussed: none  Separate significant chronic issues discussed: Tobacco use - counseled on cessation, discussed risks of tobacco. Baseline EKG today Advised he cut back on alcohol as well Advised CT lung cancer screen,  he will check insurance coverage Advised weight loss through healthy diet and exercise  Dennis Fox was seen today for annual exam.  Diagnoses and all orders for this visit:  Annual physical exam -     POCT Urinalysis DIP (Proadvantage Device) -     Comprehensive metabolic panel -     Lipid panel -     CBC -     PSA -     Hemoglobin A1c -     EKG 12-Lead -     Ambulatory referral to Gastroenterology -     CT CHEST LUNG CA SCREEN LOW DOSE W/O CM; Future  Seasonal allergic rhinitis, unspecified trigger  Gastroesophageal reflux disease, esophagitis presence not specified  Hyperlipidemia LDL goal <70  Tobacco use disorder -     EKG  12-Lead -     CT CHEST LUNG CA SCREEN LOW DOSE W/O CM; Future  Screening for prostate cancer -     PSA  Screen for colon cancer -     Ambulatory referral to Gastroenterology  Encounter for screening for lung cancer -     CT CHEST LUNG CA SCREEN LOW DOSE W/O CM; Future  Family history of prostate cancer  Follow-up pending labs, yearly for physical

## 2017-04-04 LAB — HIV ANTIBODY (ROUTINE TESTING W REFLEX): HIV 1&2 Ab, 4th Generation: NONREACTIVE

## 2017-04-04 LAB — PSA: PSA: 0.3 ng/mL (ref <=4.0)

## 2017-04-04 LAB — HEMOGLOBIN A1C
Hgb A1c MFr Bld: 5.1 % (ref <5.7)
Mean Plasma Glucose: 100 mg/dL

## 2017-04-05 ENCOUNTER — Other Ambulatory Visit: Payer: Self-pay | Admitting: Medical

## 2017-04-05 MED ORDER — PRAVASTATIN SODIUM 20 MG PO TABS: 20.0000 mg | ORAL_TABLET | Freq: Every evening | ORAL | 0 refills | Status: DC

## 2017-04-05 MED ORDER — ASPIRIN EC 81 MG PO TBEC: 81.0000 mg | DELAYED_RELEASE_TABLET | Freq: Every day | ORAL | 3 refills | Status: DC

## 2017-04-15 ENCOUNTER — Other Ambulatory Visit: Payer: Self-pay | Admitting: Family Medicine

## 2017-04-15 DIAGNOSIS — M545 Low back pain, unspecified: Secondary | ICD-10-CM

## 2017-04-23 ENCOUNTER — Telehealth: Payer: Self-pay | Admitting: Family Medicine

## 2017-04-23 ENCOUNTER — Other Ambulatory Visit: Payer: Self-pay | Admitting: Medical

## 2017-04-23 DIAGNOSIS — M545 Low back pain, unspecified: Secondary | ICD-10-CM

## 2017-04-23 DIAGNOSIS — G8929 Other chronic pain: Secondary | ICD-10-CM

## 2017-04-23 MED ORDER — CYCLOBENZAPRINE HCL 10 MG PO TABS: 10.0000 mg | ORAL_TABLET | Freq: Three times a day (TID) | ORAL | 0 refills | Status: DC | PRN

## 2017-04-23 NOTE — Telephone Encounter (Signed)
[facility] refill request Cyclobensapr TAB 10 mg  #20  By mouth 3 times daily as need for muscle spasms

## 2017-06-03 ENCOUNTER — Encounter: Payer: Self-pay | Admitting: Medical

## 2017-07-11 ENCOUNTER — Other Ambulatory Visit: Payer: Self-pay | Admitting: Medical

## 2017-11-02 ENCOUNTER — Ambulatory Visit: Payer: BLUE CROSS/BLUE SHIELD | Admitting: Family Medicine

## 2017-11-02 DIAGNOSIS — Z6835 Body mass index (BMI) 35.0-35.9, adult: Secondary | ICD-10-CM | POA: Diagnosis not present

## 2017-11-02 DIAGNOSIS — R05 Cough: Secondary | ICD-10-CM | POA: Diagnosis not present

## 2017-11-02 DIAGNOSIS — J0100 Acute maxillary sinusitis, unspecified: Secondary | ICD-10-CM | POA: Diagnosis not present

## 2018-06-01 DIAGNOSIS — M25562 Pain in left knee: Secondary | ICD-10-CM | POA: Diagnosis not present

## 2018-07-02 DIAGNOSIS — M1712 Unilateral primary osteoarthritis, left knee: Secondary | ICD-10-CM | POA: Diagnosis not present

## 2019-07-06 ENCOUNTER — Ambulatory Visit: Payer: Managed Care, Other (non HMO)

## 2019-07-06 ENCOUNTER — Ambulatory Visit
Admission: EM | Admit: 2019-07-06 | Discharge: 2019-07-06 | Disposition: A | Discharge status: Home / Self Care | Payer: Managed Care, Other (non HMO) | Attending: Emergency Medicine | Admitting: Emergency Medicine

## 2019-07-06 ENCOUNTER — Other Ambulatory Visit: Payer: Self-pay

## 2019-07-06 ENCOUNTER — Ambulatory Visit (INDEPENDENT_AMBULATORY_CARE_PROVIDER_SITE_OTHER): Payer: Managed Care, Other (non HMO)

## 2019-07-06 DIAGNOSIS — S2241XA Multiple fractures of ribs, right side, initial encounter for closed fracture: Secondary | ICD-10-CM

## 2019-07-06 DIAGNOSIS — W19XXXA Unspecified fall, initial encounter: Secondary | ICD-10-CM

## 2019-07-06 DIAGNOSIS — R0782 Intercostal pain: Secondary | ICD-10-CM | POA: Diagnosis not present

## 2019-07-06 MED ORDER — CYCLOBENZAPRINE HCL 5 MG PO TABS: 5.0000 mg | ORAL_TABLET | Freq: Two times a day (BID) | ORAL | 0 refills | Status: AC | PRN

## 2019-07-06 NOTE — ED Provider Notes (Signed)
[facility]    CSN: [id number] Arrival date & time: 07/06/19  4097      History   Chief Complaint Chief Complaint  Patient presents with  . Rib pain    HPI Dennis Fox is a 53 y.o. male presenting for right posterior rib pain and pain with deep inspiration status post fall on a 2 x 4 on Tuesday.  Patient denies head trauma, LOC, snap/pop/tearing sensation.  Patient did not have difficulty with deep inspiration until last night when he did feel a pop.  Patient has been using heat, ice with minimal relief.  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  Patient does endorse previous injury to same area approximately 7 years ago, though was never evaluated for that.   Past Medical History:  Diagnosis Date  . Allergy   . Dyslipidemia   . GERD (gastroesophageal reflux disease)   . Tobacco use disorder     Patient Active Problem List   Diagnosis Date Noted  . Family history of prostate cancer 04/03/2017  . Screen for colon cancer 04/03/2017  . Annual physical exam 04/03/2017  . Allergic rhinitis, seasonal 02/20/2012  . GERD (gastroesophageal reflux disease) 02/20/2012  . Hyperlipidemia LDL goal <70 02/20/2012  . Tobacco use disorder 05/27/2011    Past Surgical History:  Procedure Laterality Date  . EYE SURGERY     pterygium  . KNEE SURGERY     age 10yo       Home Medications    Prior to Admission medications   Medication Sig Start Date End Date Taking? Authorizing Provider  cyclobenzaprine (FLEXERIL) 5 MG tablet Take 1 tablet (5 mg total) by mouth 2 (two) times daily as needed for up to 5 days for muscle spasms. 07/06/19 07/11/19  Hall-Potvin, [place], PA-C    Family History Family History  Problem Relation Age of Onset  . Heart disease Mother 22       CABG  . Cancer Mother        non hodkins lymphoma  . Heart disease Father 47       CABG  . COPD Father   . Pneumonia  Father        died of PNA  . Diabetes Father   . Cancer Maternal Uncle        uncle  . Cancer Paternal Uncle   . Stroke Neg Hx     Social History Social History   Tobacco Use  . Smoking status: Current Every Day Smoker    Packs/day: 1.00    Years: 30.00    Pack years: 30.00    Last attempt to quit: 10/24/2011    Years since quitting: 7.7  . Smokeless tobacco: Never Used  Substance Use Topics  . Alcohol use: Yes    Alcohol/week: 22.0 standard drinks    Types: 1 Standard drinks or equivalent, 21 Cans of beer per week  . Drug use: No     Allergies   Patient has no known allergies.   Review of Systems Review of Systems  Constitutional: Negative for fatigue and fever.  Respiratory: Negative for cough, chest tightness, shortness of breath and wheezing.   Cardiovascular: Negative for palpitations.       Positive for posterolateral chest pain with deep inspiration  Gastrointestinal: Negative for abdominal pain, diarrhea and vomiting.  Musculoskeletal: Negative for arthralgias and myalgias.  Skin: Negative for rash and wound.  Neurological: Negative for speech difficulty and headaches.  All other systems reviewed and are negative.   Physical Exam Triage Vital Signs ED Triage Vitals [07/06/19 0820]  Enc Vitals Group     BP 131/73     Pulse Rate (!) 50     Resp 18     Temp 97.7 F (36.5 C)     Temp Source Oral     SpO2 95 %     Weight      Height      Head Circumference      Peak Flow      Pain Score 10     Pain Loc      Pain Edu?      Excl. in GC?    No data found.  Updated Vital Signs BP 131/73 (BP Location: Left Arm)   Pulse (!) 50   Temp 97.7 F (36.5 C) (Oral)   Resp 18   SpO2 95%    Physical Exam Constitutional:      General: He is not in acute distress.    Appearance: He is not ill-appearing.  HENT:     Head: Normocephalic and atraumatic.  Eyes:     General: No scleral icterus.    Pupils: Pupils are equal, round, and reactive to light.   Neck:     Comments: Trachea midline, negative JVD Cardiovascular:     Rate and Rhythm: Normal rate and regular rhythm.     Heart sounds: No murmur. No gallop.      Comments: HR 47-53 during encounter.  Patient states this is chronic/stable: Denies symptoms Pulmonary:     Effort: Pulmonary effort is normal. No respiratory distress.     Breath sounds: No wheezing.     Comments: Decreased air sounds bilaterally Chest:     Chest wall: Tenderness present. No deformity, swelling, crepitus or edema.     Comments: Posterolateral tenderness over ribs 8-11 Skin:    Coloration: Skin is not jaundiced or pale.  Neurological:     Mental Status: He is alert and oriented to person, place, and time.      UC Treatments / Results  Labs (all labs ordered are listed, but only abnormal results are displayed) Labs Reviewed - No data to display  EKG   Radiology Dg Ribs Unilateral W/chest Right  Result Date: 07/06/2019 CLINICAL DATA:  Post fall 1 week ago with right rib pain. EXAM: RIGHT RIBS AND CHEST - 3+ VIEW COMPARISON:  None. FINDINGS: Lungs are adequately inflated without consolidation, effusion or pneumothorax. Cardiomediastinal silhouette is normal. There are degenerative changes of the spine. There are minimally displaced fractures of the posterolateral right ninth and tenth ribs. IMPRESSION: No acute cardiopulmonary disease. Minimally displaced posterolateral right ninth and tenth rib fractures likely acute. Electronically Signed   By: Elberta Fortisaniel  Boyle M.D.   On: 07/06/2019 09:06    Procedures Procedures (including critical care time)  Medications Ordered in UC Medications - No data to display  Initial Impression / Assessment and Plan / UC Course  I have reviewed the triage vital signs and the nursing notes.  Pertinent labs & imaging results that were available during my care of the patient were reviewed by me and considered in my medical decision making (see chart for details).      Rib x-ray obtained in office, reviewed by me and radiology: Ulcerative for minimally displaced fractures of the posterolateral right ninth and 10th ribs.  Felt to be likely acute.  No evidence of  consolidation, effusion, or pneumothorax.  Reviewed findings with patient who verbalized understanding.  Patient to add naproxen, cyclobenzaprine to current regimen: We will supplement with Tylenol as well.  Stressed importance of incentive spirometry.  Patient declined advice, will do breathing exercises at home.  Patient to use compression during exercises for additional pain relief.  Return precautions discussed, patient verbalized understanding and is agreeable to plan. Final Clinical Impressions(s) / UC Diagnoses   Final diagnoses:  Closed fracture of multiple ribs of right side, initial encounter     Discharge Instructions     Recommend RICE: rest, ice, compression, elevation as needed for pain.    Heat therapy (hot compress, warm wash red, hot showers, etc.) can help relax muscles and soothe muscle aches. Cold therapy (ice packs) can be used to help swelling both after injury and after prolonged use of areas of chronic pain/aches.  For pain: recommend 350 mg-1000 mg of Tylenol (acetaminophen) and/or 200 mg - 800 mg of Advil (ibuprofen, Motrin) every 8 hours as needed.  May alternate between the two throughout the day as they are generally safe to take together.  DO NOT exceed more than 3000 mg of Tylenol or 3200 mg of ibuprofen in a 24 hour period as this could damage your stomach, kidneys, liver, or increase your bleeding risk.  May take muscle relaxer as needed for severe pain / spasm.  (This medication may cause you to become tired so it is important you do not drink alcohol or operate heavy machinery while on this medication.  Recommend your first dose to be taken before bedtime to monitor for side effects safely)  Return for worsening pain, SOB, chest pain, fever.    ED Prescriptions     Medication Sig Dispense Auth. Provider   cyclobenzaprine (FLEXERIL) 5 MG tablet Take 1 tablet (5 mg total) by mouth 2 (two) times daily as needed for up to 5 days for muscle spasms. 20 tablet Hall-Potvin, [place], PA-C     PDMP not reviewed this encounter.   Neldon Mc [place], [place] 07/06/19 [id number]

## 2019-07-06 NOTE — ED Triage Notes (Signed)
Pt states fell last Tuesday, pain to rt rib. States started a new job and now hurting again and needs a work note

## 2019-07-06 NOTE — Discharge Instructions (Signed)
Recommend RICE: rest, ice, compression, elevation as needed for pain.    Heat therapy (hot compress, warm wash red, hot showers, etc.) can help relax muscles and soothe muscle aches. Cold therapy (ice packs) can be used to help swelling both after injury and after prolonged use of areas of chronic pain/aches.  For pain: recommend 350 mg-1000 mg of Tylenol (acetaminophen) and/or 200 mg - 800 mg of Advil (ibuprofen, Motrin) every 8 hours as needed.  May alternate between the two throughout the day as they are generally safe to take together.  DO NOT exceed more than 3000 mg of Tylenol or 3200 mg of ibuprofen in a 24 hour period as this could damage your stomach, kidneys, liver, or increase your bleeding risk.  May take muscle relaxer as needed for severe pain / spasm.  (This medication may cause you to become tired so it is important you do not drink alcohol or operate heavy machinery while on this medication.  Recommend your first dose to be taken before bedtime to monitor for side effects safely)  Return for worsening pain, SOB, chest pain, fever.

## 2020-02-01 DIAGNOSIS — D225 Melanocytic nevi of trunk: Secondary | ICD-10-CM | POA: Diagnosis not present

## 2020-02-01 DIAGNOSIS — D2272 Melanocytic nevi of left lower limb, including hip: Secondary | ICD-10-CM | POA: Diagnosis not present

## 2020-02-01 DIAGNOSIS — B078 Other viral warts: Secondary | ICD-10-CM | POA: Diagnosis not present

## 2020-02-01 DIAGNOSIS — Z1283 Encounter for screening for malignant neoplasm of skin: Secondary | ICD-10-CM | POA: Diagnosis not present

## 2020-02-01 DIAGNOSIS — D485 Neoplasm of uncertain behavior of skin: Secondary | ICD-10-CM | POA: Diagnosis not present

## 2020-04-12 ENCOUNTER — Other Ambulatory Visit: Payer: Self-pay

## 2020-04-12 ENCOUNTER — Encounter: Payer: Self-pay | Admitting: Medical

## 2020-04-12 ENCOUNTER — Ambulatory Visit: Payer: BC Managed Care – PPO | Admitting: Medical

## 2020-04-12 VITALS — BP 138/80 | HR 65 | Ht 68.0 in | Wt 214.0 lb

## 2020-04-12 DIAGNOSIS — H6690 Otitis media, unspecified, unspecified ear: Secondary | ICD-10-CM

## 2020-04-12 DIAGNOSIS — H60331 Swimmer's ear, right ear: Secondary | ICD-10-CM

## 2020-04-12 MED ORDER — AMOXICILLIN 875 MG PO TABS: 875.0000 mg | ORAL_TABLET | Freq: Two times a day (BID) | ORAL | 0 refills | Status: DC

## 2020-04-12 MED ORDER — HYDROCODONE-ACETAMINOPHEN 5-325 MG PO TABS: 1.0000 | ORAL_TABLET | Freq: Four times a day (QID) | ORAL | 0 refills | Status: DC | PRN

## 2020-04-12 MED ORDER — CIPROFLOXACIN HCL 0.2 % OT SOLN: 0.2000 mL | Freq: Two times a day (BID) | OTIC | 1 refills | Status: DC

## 2020-04-12 NOTE — Progress Notes (Signed)
Subjective:  Dennis Fox is a 54 y.o. male who presents for Chief Complaint  Patient presents with  . Ear Pain    bilateral-right is worse      Here for ear infection.  Symptoms have been going on for the last several days.  He swims regularly during the summer has a pool at home.  He has had swimmer's ear in the past.  He is ears been painful the last several days with some drainage out the right ear.  The right ear is worse than the left.  It feels swollen.  No fever no body aches no chills no nausea no vomiting no other respiratory symptoms.  No concern for Covid.  No sick contacts.  No other aggravating or relieving factors.    No other c/o.  The following portions of the patient's history were reviewed and updated as appropriate: allergies, current medications, past family history, past medical history, past social history, past surgical history and problem list.  ROS Otherwise as in subjective above  Objective: BP 138/80   Pulse 65   Ht 5\' 8"  (1.727 m)   Wt 214 lb (97.1 kg)   SpO2 95%   BMI 32.54 kg/m   General appearance: alert, no distress, well developed, well nourished HEENT: normocephalic, sclerae anicteric, conjunctiva pink and moist, bilateral ear exam somewhat tender with speculum, left ear canal clear would not swollen but the TM is erythematous and bulging, right ear canal is swollen, exam is tender, there is yellowish-gray debris suggestive of otitis externa, nares patent, no discharge or erythema, pharynx normal Oral cavity: MMM, no lesions Neck: supple, no lymphadenopathy, no thyromegaly, no masses   Assessment: Encounter Diagnoses  Name Primary?  . Acute swimmer's ear of right side Yes  . Acute otitis media, unspecified otitis media type      Plan: We discussed exam findings, symptoms.  Begin medication as below.  In general advised to use alcohol drops a few drops in each ear after he swims after drying off.  In general do not get back in the water to  swim for the next week until this infection is cleared up.  Do not use any alcohol drops until we are finished with the medications below.   Mattheu was seen today for ear pain.  Diagnoses and all orders for this visit:  Acute swimmer's ear of right side  Acute otitis media, unspecified otitis media type  Other orders -     amoxicillin (AMOXIL) 875 MG tablet; Take 1 tablet (875 mg total) by mouth 2 (two) times daily. -     Ciprofloxacin HCl 0.2 % otic solution; Place 0.2 mLs into the right ear 2 (two) times daily. -     HYDROcodone-acetaminophen (NORCO) 5-325 MG tablet; Take 1 tablet by mouth every 6 (six) hours as needed.    Follow up: prn

## 2020-04-13 ENCOUNTER — Ambulatory Visit: Payer: Self-pay | Admitting: Medical

## 2020-07-03 ENCOUNTER — Ambulatory Visit: Payer: BC Managed Care – PPO | Admitting: Family Medicine

## 2020-07-04 ENCOUNTER — Encounter: Payer: Self-pay | Admitting: Medical

## 2020-07-09 ENCOUNTER — Other Ambulatory Visit: Payer: Self-pay

## 2020-07-09 ENCOUNTER — Encounter: Payer: Self-pay | Admitting: Emergency Medicine

## 2020-07-09 ENCOUNTER — Ambulatory Visit
Admission: EM | Admit: 2020-07-09 | Discharge: 2020-07-09 | Disposition: A | Discharge status: Home / Self Care | Payer: BC Managed Care – PPO | Attending: Family Medicine | Admitting: Family Medicine

## 2020-07-09 DIAGNOSIS — J209 Acute bronchitis, unspecified: Secondary | ICD-10-CM

## 2020-07-09 MED ORDER — AZITHROMYCIN 250 MG PO TABS: 250.0000 mg | ORAL_TABLET | Freq: Every day | ORAL | 0 refills | Status: DC

## 2020-07-09 NOTE — ED Triage Notes (Signed)
Patient sinus pressure, headache, and nasal drainage x 2 weeks.   Patient denies fever and SOB.   Patient endorses productive cough "yellow color sputum".    History of "seasonal sinus infection" per patient statement.

## 2020-07-09 NOTE — Discharge Instructions (Signed)
Take Mucinex along with Zithromax

## 2020-07-09 NOTE — ED Provider Notes (Signed)
[facility]    CSN: [id number] Arrival date & time: 07/09/20  1238      History   Chief Complaint Chief Complaint  Patient presents with  . Nasal Congestion  . Headache    HPI Dennis Fox is a 54 y.o. male.   Complains of sinus pain and pressure postnasal drainage and productive cough.  Symptoms have been present for several weeks.  Patient is a smoker.  HPI  Past Medical History:  Diagnosis Date  . Allergy   . Dyslipidemia   . GERD (gastroesophageal reflux disease)   . Tobacco use disorder     Patient Active Problem List   Diagnosis Date Noted  . Family history of prostate cancer 04/03/2017  . Screen for colon cancer 04/03/2017  . Annual physical exam 04/03/2017  . Allergic rhinitis, seasonal 02/20/2012  . GERD (gastroesophageal reflux disease) 02/20/2012  . Hyperlipidemia LDL goal <70 02/20/2012  . Tobacco use disorder 05/27/2011    Past Surgical History:  Procedure Laterality Date  . EYE SURGERY     pterygium  . KNEE SURGERY     age 49yo       Home Medications    Prior to Admission medications   Medication Sig Start Date End Date Taking? Authorizing Provider  amoxicillin (AMOXIL) 875 MG tablet Take 1 tablet (875 mg total) by mouth 2 (two) times daily. 04/12/20   Tysinger, Kermit Balo, PA-C  Ciprofloxacin HCl 0.2 % otic solution Place 0.2 mLs into the right ear 2 (two) times daily. 04/12/20   Tysinger, Kermit Balo, PA-C  HYDROcodone-acetaminophen (NORCO) 5-325 MG tablet Take 1 tablet by mouth every 6 (six) hours as needed. 04/12/20   Tysinger, Kermit Balo, PA-C    Family History Family History  Problem Relation Age of Onset  . Heart disease Mother 74       CABG  . Cancer Mother        non hodkins lymphoma  . Heart disease Father 40       CABG  . COPD Father   . Pneumonia Father        died of PNA  . Diabetes Father   . Cancer Maternal Uncle        uncle  . Cancer Paternal Uncle   . Stroke Neg Hx     Social History Social History    Tobacco Use  . Smoking status: Current Every Day Smoker    Packs/day: 1.00    Years: 30.00    Pack years: 30.00    Types: Cigarettes    Last attempt to quit: 10/24/2011    Years since quitting: 8.7  . Smokeless tobacco: Never Used  Vaping Use  . Vaping Use: Never used  Substance Use Topics  . Alcohol use: Yes    Alcohol/week: 22.0 standard drinks    Types: 21 Cans of beer, 1 Standard drinks or equivalent per week  . Drug use: No     Allergies   Patient has no known allergies.   Review of Systems Review of Systems  HENT: Positive for postnasal drip and sinus pressure.   Respiratory: Positive for cough.   All other systems reviewed and are negative.    Physical Exam Triage Vital Signs ED Triage Vitals  Enc Vitals Group     BP 07/09/20 1256 115/76     Pulse Rate 07/09/20 1256 (!) 55     Resp 07/09/20 1256 17     Temp 07/09/20 1256 97.6 F (36.4 C)  Temp Source 07/09/20 1256 Oral     SpO2 07/09/20 1256 93 %     Weight --      Height --      Head Circumference --      Peak Flow --      Pain Score 07/09/20 1253 3     Pain Loc --      Pain Edu? --      Excl. in GC? --    No data found.  Updated Vital Signs BP 115/76 (BP Location: Left Arm)   Pulse (!) 55   Temp 97.6 F (36.4 C) (Oral)   Resp 17   SpO2 93%   Visual Acuity Right Eye Distance:   Left Eye Distance:   Bilateral Distance:    Right Eye Near:   Left Eye Near:    Bilateral Near:     Physical Exam Vitals and nursing note reviewed.  Constitutional:      Appearance: He is well-developed.  HENT:     Head: Normocephalic.     Comments: Tenderness with percussion over maxillary sinuses    Mouth/Throat:     Mouth: Mucous membranes are moist.  Cardiovascular:     Rate and Rhythm: Normal rate and regular rhythm.  Pulmonary:     Effort: Pulmonary effort is normal.     Breath sounds: Wheezing and rhonchi present.  Neurological:     Mental Status: He is alert.  Psychiatric:         Behavior: Behavior normal.      UC Treatments / Results  Labs (all labs ordered are listed, but only abnormal results are displayed) Labs Reviewed - No data to display  EKG   Radiology No results found.  Procedures Procedures (including critical care time)  Medications Ordered in UC Medications - No data to display  Initial Impression / Assessment and Plan / UC Course  I have reviewed the triage vital signs and the nursing notes.  Pertinent labs & imaging results that were available during my care of the patient were reviewed by me and considered in my medical decision making (see chart for details).     Sinobronchitis.  Will treat with a Z-Pak and recommend OTC Mucinex Final Clinical Impressions(s) / UC Diagnoses   Final diagnoses:  None   Discharge Instructions   None    ED Prescriptions    None     PDMP not reviewed this encounter.   Frederica Kuster, MD 07/09/20 1315

## 2020-10-23 ENCOUNTER — Telehealth: Payer: Self-pay

## 2020-10-23 NOTE — Telephone Encounter (Signed)
Work him in

## 2020-10-23 NOTE — Telephone Encounter (Signed)
Pt called and needs CPE by the end of March for his job and you have no available spots on your schedule.  Can I work him in some where or do you want me to tell him to go elsewhere?

## 2020-10-26 NOTE — Telephone Encounter (Signed)
Called pt no answer, mailbox not set up

## 2020-10-30 NOTE — Telephone Encounter (Signed)
Called & scheduled CPE

## 2020-11-02 ENCOUNTER — Other Ambulatory Visit: Payer: Self-pay

## 2020-11-02 ENCOUNTER — Ambulatory Visit: Payer: BC Managed Care – PPO | Admitting: Medical

## 2020-11-02 ENCOUNTER — Encounter: Payer: Self-pay | Admitting: Medical

## 2020-11-02 VITALS — BP 150/80 | HR 54 | Ht 68.0 in | Wt 215.0 lb

## 2020-11-02 DIAGNOSIS — Z720 Tobacco use: Secondary | ICD-10-CM

## 2020-11-02 DIAGNOSIS — I10 Essential (primary) hypertension: Secondary | ICD-10-CM

## 2020-11-02 DIAGNOSIS — Z6832 Body mass index (BMI) 32.0-32.9, adult: Secondary | ICD-10-CM

## 2020-11-02 DIAGNOSIS — Z1211 Encounter for screening for malignant neoplasm of colon: Secondary | ICD-10-CM

## 2020-11-02 DIAGNOSIS — L308 Other specified dermatitis: Secondary | ICD-10-CM | POA: Diagnosis not present

## 2020-11-02 DIAGNOSIS — H538 Other visual disturbances: Secondary | ICD-10-CM | POA: Diagnosis not present

## 2020-11-02 DIAGNOSIS — R0683 Snoring: Secondary | ICD-10-CM

## 2020-11-02 NOTE — Progress Notes (Signed)
Subjective:  Dennis Fox is a 55 y.o. male who presents for Chief Complaint  Patient presents with  . Blood Pressure Check    With blurred vision       Here for concerns about blood pressure and vision.  He has a blood pressure cuff at home. Been checking BP at home. This morning 179/89.  Been seeing 150s somewhat frequently.   Bottom number usually runs good.  Sometimes up to 89.  No chest pain, no dyspnea, no edema.  No numbness, no tinglin, no weakness.  No slurred speech, no confusion.    He works in [employer] so he says his body hurts all the time in general.  He has not seen an eye doctor in 5 years  He continues to smoke cigarettes regularly.  He drinks 4-5 alcoholic drinks per day including beer and whiskey  He does eat a lot of fast food  No other aggravating or relieving factors.    No other c/o.  Past Medical History:  Diagnosis Date  . Allergy   . Dyslipidemia   . GERD (gastroesophageal reflux disease)   . Tobacco use disorder      The following portions of the patient's history were reviewed and updated as appropriate: allergies, current medications, past family history, past medical history, past social history, past surgical history and problem list.  ROS Otherwise as in subjective above    Objective: BP (!) 150/80   Pulse (!) 54   Ht 5\' 8"  (1.727 m)   Wt 215 lb (97.5 kg)   SpO2 94%   BMI 32.69 kg/m   General appearance: alert, no distress, well developed, well nourished Neck: supple, no lymphadenopathy, no thyromegaly, no masses Heart: RRR, normal S1, S2, no murmurs Lungs: CTA bilaterally, no wheezes, rhonchi, or rales Abdomen: +bs, soft, non tender, non distended, no masses, no hepatomegaly, no splenomegaly Pulses: 2+ radial pulses, 2+ pedal pulses, normal cap refill Ext: no edema    Assessment: Encounter Diagnoses  Name Primary?  . Essential hypertension, benign Yes  . Tobacco use   . Colon cancer screening   . Blurred vision   .  Snoring   . BMI 32.0-32.9,adult      Plan: We discussed his symptoms and concerns.  He has no blurred vision today. We discussed diagnosis of high blood pressure, possible complications.  He does not want to start medication today.  He realizes there are some things he can do with lifestyle  We discussed importance of low-salt in the diet, healthy low-fat diet with less animal products, cook more from home instead of eating out as much as he was doing, advised the need to quit smoking.  Counseled on significantly decreasing alcohol consumption.  Very stressed importance of exercise  He has a appointment scheduled in 2 weeks to do a fasting physical and labs.  We will see him then as he wants to defer blood work and other evaluation till then  We also discussed possibly doing a sleep study since he snores.  We discussed that alcohol can make sleep apnea worse.  We discussed colon cancer screening.  Discussed Cologuard versus colonoscopy.  He is going to check insurance on this  Advised he schedule with eye doctor now.   Dennis Fox was seen today for blood pressure check.  Diagnoses and all orders for this visit:  Essential hypertension, benign  Tobacco use  Colon cancer screening -     Cologuard  Blurred vision  Snoring  BMI 32.0-32.9,adult  Follow up: as planned in 2 weeks for physical

## 2020-11-12 IMAGING — DX DG RIBS W/ CHEST 3+V*R*
4 series · 4 of 4 positions shown · non-contrast
Comparison: None.

CLINICAL DATA: Post fall 1 week ago with right rib pain.

EXAM:
RIGHT RIBS AND CHEST - 3+ VIEW

[chest pa]
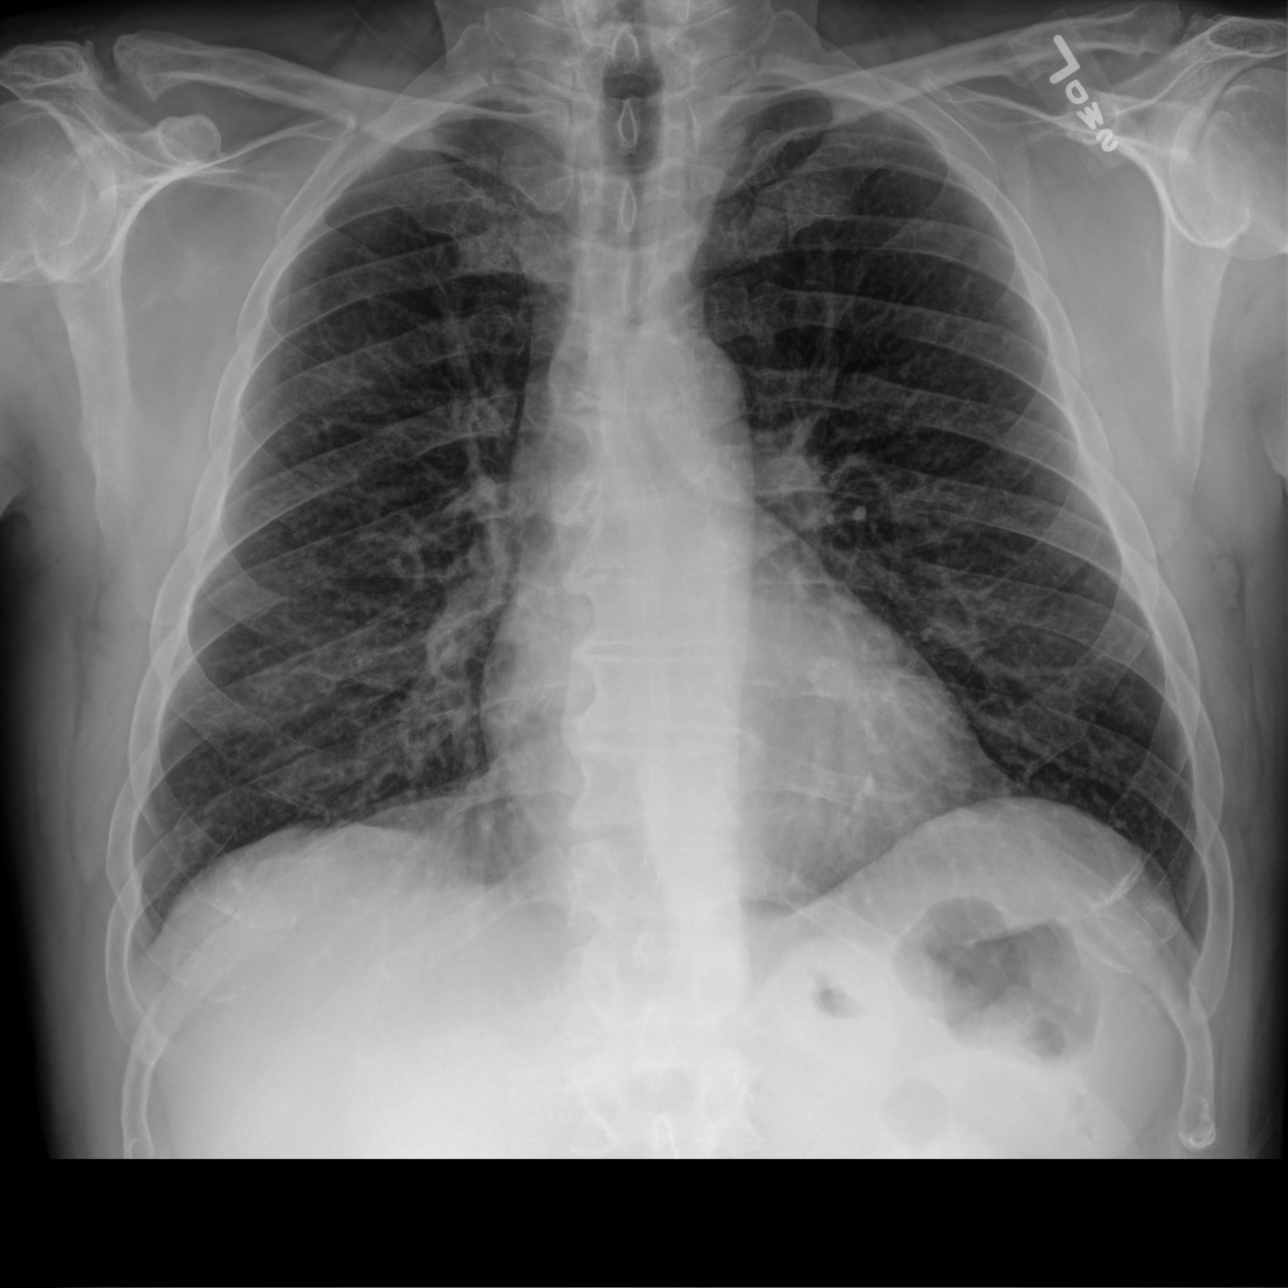

[hemithorax (ribs) ap (1 of 3)]
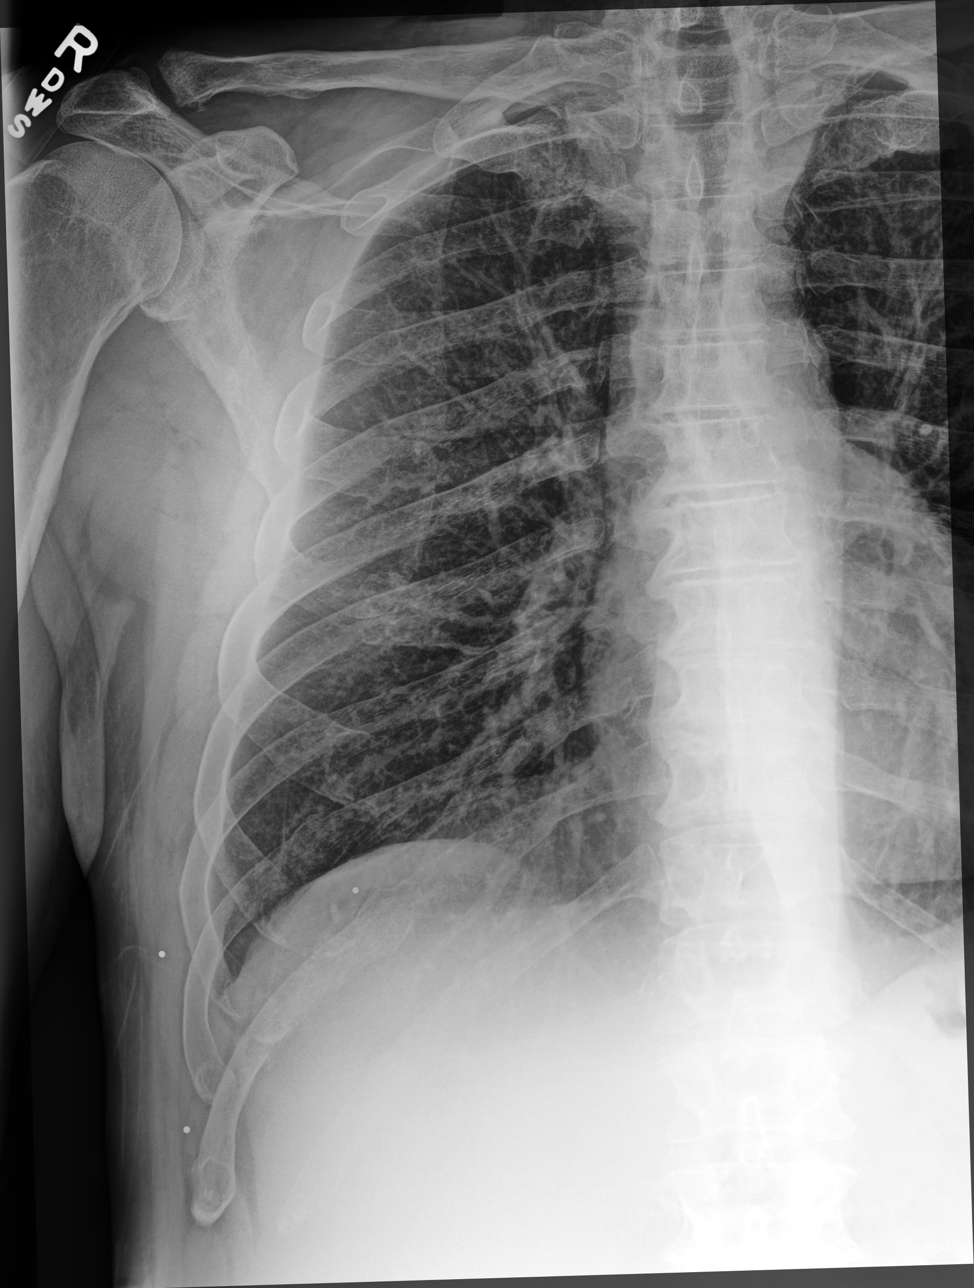

[hemithorax (ribs) ap (2 of 3)]
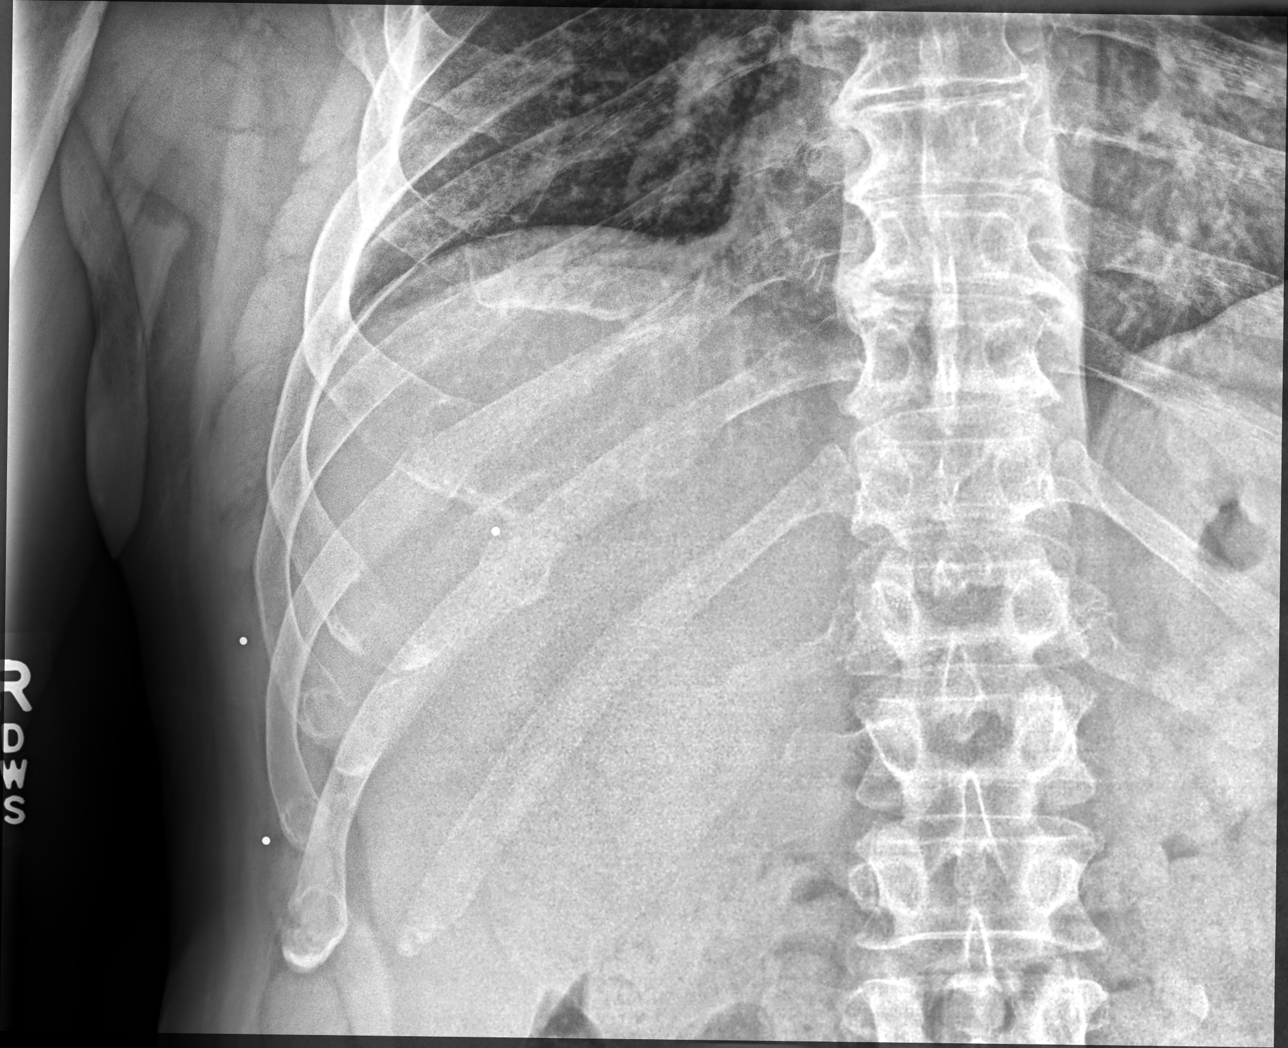

[hemithorax (ribs) ap (3 of 3)]
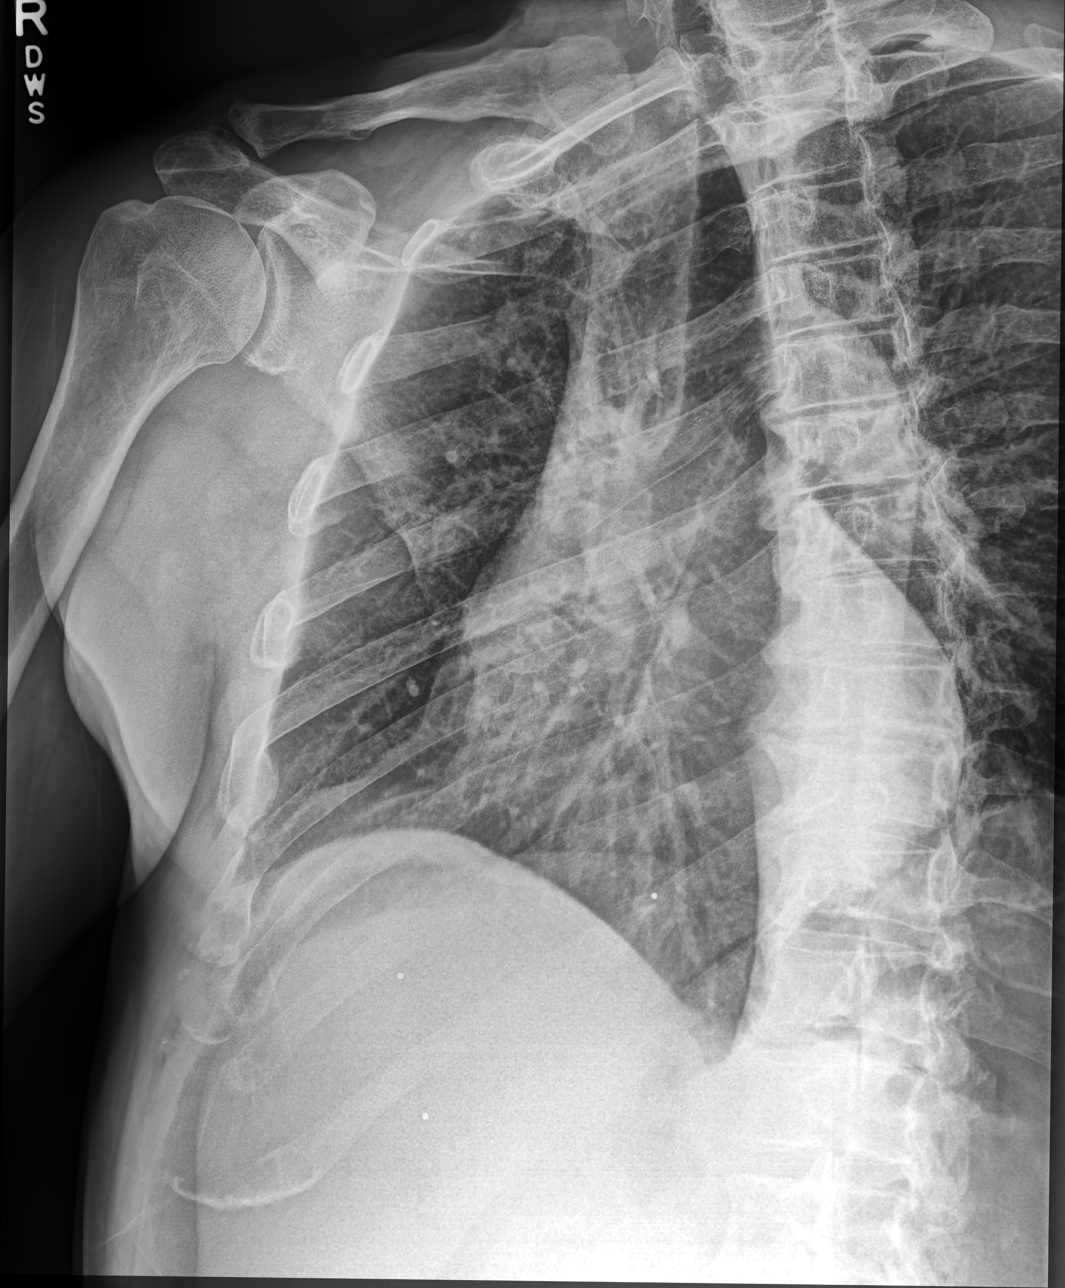

[4 of 4 positions shown; findings below may reference images not displayed]

FINDINGS: Lungs are adequately inflated without consolidation, effusion or
pneumothorax. Cardiomediastinal silhouette is normal. There are
degenerative changes of the spine. There are minimally displaced
fractures of the posterolateral right ninth and tenth ribs.
IMPRESSION: No acute cardiopulmonary disease.

Minimally displaced posterolateral right ninth and tenth rib
fractures likely acute.

## 2020-11-14 ENCOUNTER — Encounter: Payer: BC Managed Care – PPO | Admitting: Medical

## 2020-11-14 DIAGNOSIS — Z1211 Encounter for screening for malignant neoplasm of colon: Secondary | ICD-10-CM | POA: Diagnosis not present

## 2020-11-14 LAB — COLOGUARD: Cologuard: NEGATIVE

## 2020-11-18 LAB — COLOGUARD: COLOGUARD: NEGATIVE

## 2020-11-20 ENCOUNTER — Telehealth: Payer: Self-pay | Admitting: Medical

## 2020-11-20 NOTE — Telephone Encounter (Signed)
Please let them know that the Cologuard screening for colon cancer was negative.  This indicates a lower likelihood that colorectal cancer is present.   Lets plan to repeat this in 3 years.  However, if the develop bowel changes, blood in stool, unexpected weight loss, or other new bowel changes, then recheck. 

## 2020-11-20 NOTE — Telephone Encounter (Signed)
Letter has been sent to patient

## 2020-11-23 ENCOUNTER — Ambulatory Visit (INDEPENDENT_AMBULATORY_CARE_PROVIDER_SITE_OTHER): Payer: BC Managed Care – PPO | Admitting: Medical

## 2020-11-23 ENCOUNTER — Encounter: Payer: Self-pay | Admitting: Medical

## 2020-11-23 ENCOUNTER — Other Ambulatory Visit: Payer: Self-pay

## 2020-11-23 DIAGNOSIS — Z6832 Body mass index (BMI) 32.0-32.9, adult: Secondary | ICD-10-CM

## 2020-11-23 DIAGNOSIS — J302 Other seasonal allergic rhinitis: Secondary | ICD-10-CM | POA: Diagnosis not present

## 2020-11-23 DIAGNOSIS — K219 Gastro-esophageal reflux disease without esophagitis: Secondary | ICD-10-CM

## 2020-11-23 DIAGNOSIS — Z125 Encounter for screening for malignant neoplasm of prostate: Secondary | ICD-10-CM

## 2020-11-23 DIAGNOSIS — Z1159 Encounter for screening for other viral diseases: Secondary | ICD-10-CM | POA: Insufficient documentation

## 2020-11-23 DIAGNOSIS — R0689 Other abnormalities of breathing: Secondary | ICD-10-CM

## 2020-11-23 DIAGNOSIS — E785 Hyperlipidemia, unspecified: Secondary | ICD-10-CM

## 2020-11-23 DIAGNOSIS — Z1211 Encounter for screening for malignant neoplasm of colon: Secondary | ICD-10-CM | POA: Diagnosis not present

## 2020-11-23 DIAGNOSIS — Z0000 Encounter for general adult medical examination without abnormal findings: Secondary | ICD-10-CM

## 2020-11-23 DIAGNOSIS — I10 Essential (primary) hypertension: Secondary | ICD-10-CM

## 2020-11-23 DIAGNOSIS — Z8042 Family history of malignant neoplasm of prostate: Secondary | ICD-10-CM | POA: Diagnosis not present

## 2020-11-23 DIAGNOSIS — C44722 Squamous cell carcinoma of skin of right lower limb, including hip: Secondary | ICD-10-CM | POA: Diagnosis not present

## 2020-11-23 DIAGNOSIS — R0600 Dyspnea, unspecified: Secondary | ICD-10-CM

## 2020-11-23 DIAGNOSIS — F17200 Nicotine dependence, unspecified, uncomplicated: Secondary | ICD-10-CM

## 2020-11-23 DIAGNOSIS — R0683 Snoring: Secondary | ICD-10-CM

## 2020-11-23 DIAGNOSIS — I8390 Asymptomatic varicose veins of unspecified lower extremity: Secondary | ICD-10-CM

## 2020-11-23 MED ORDER — FLUTICASONE FUROATE-VILANTEROL 100-25 MCG/INH IN AEPB: 1.0000 | INHALATION_SPRAY | Freq: Every day | RESPIRATORY_TRACT | 0 refills | Status: DC

## 2020-11-23 MED ORDER — AZITHROMYCIN 250 MG PO TABS: ORAL_TABLET | ORAL | 0 refills | Status: DC

## 2020-11-23 MED ORDER — ALBUTEROL SULFATE HFA 108 (90 BASE) MCG/ACT IN AERS: 2.0000 | INHALATION_SPRAY | Freq: Four times a day (QID) | RESPIRATORY_TRACT | 0 refills | Status: DC | PRN

## 2020-11-23 MED ORDER — CETIRIZINE HCL 10 MG PO TABS: 10.0000 mg | ORAL_TABLET | Freq: Every day | ORAL | 3 refills | Status: DC

## 2020-11-23 NOTE — Patient Instructions (Signed)
This visit was a preventative care visit, also known as wellness visit or routine physical.   Topics typically include healthy lifestyle, diet, exercise, preventative care, vaccinations, sick and well care, proper use of emergency dept and after hours care, as well as other concerns.     Recommendations: Continue to return yearly for your annual wellness and preventative care visits.  This gives [place] a chance to discuss healthy lifestyle, exercise, vaccinations, review your chart record, and perform screenings where appropriate.  I recommend you see your eye doctor yearly for routine vision care.  I recommend you see your dentist yearly for routine dental care including hygiene visits twice yearly.   Vaccination recommendations were reviewed  There is no immunization history on file for this patient.   Vaccine recommendations include the following: Yearly flu shot in the fall Tetanus booster every 10 years COVID vaccine Shingles vaccine Possibly pneumococcal pneumonia vaccine   I recommend you call insurance to inquire about coverage for these vaccines and plan to update these.  We would need to give you a schedule on updating these vaccines appropriately     Screening for cancer: Colon cancer screening: Pending result from Cologard  We discussed PSA, prostate exam, and prostate cancer screening risks/benefits.     Skin cancer screening: Check your skin regularly for new changes, growing lesions, or other lesions of concern Come in for evaluation if you have skin lesions of concern.  Lung cancer screening: If you have a greater than 20 pack year history of tobacco use, then you may qualify for lung cancer screening with a chest CT scan.   Please call your insurance company to inquire about coverage for this test.  We currently don't have screenings for other cancers besides breast, cervical, colon, and lung cancers.  If you have a strong family history of cancer or have other  cancer screening concerns, please let me know.    Bone health: Get at least 150 minutes of aerobic exercise weekly Get weight bearing exercise at least once weekly Bone density test:   A bone density test is an imaging test that uses a type of X-ray to measure the amount of calcium and other minerals in your bones.  The test may be used to diagnose or screen you for a condition that causes weak or thin bones (osteoporosis), predict your risk for a broken bone (fracture), or determine how well your osteoporosis treatment is working. The bone density test is recommended for females 65 and older, or females or males <65 if certain risk factors such as thyroid disease, long term use of steroids such as for asthma or rheumatological issues, vitamin D deficiency, estrogen deficiency, family history of osteoporosis, self or family history of fragility fracture in first degree relative.    Heart health: Get at least 150 minutes of aerobic exercise weekly Limit alcohol It is important to maintain a healthy blood pressure and healthy cholesterol numbers  Heart disease screening: Screening for heart disease includes screening for blood pressure, fasting lipids, glucose/diabetes screening, BMI height to weight ratio, reviewed of smoking status, physical activity, and diet.    Goals include blood pressure 120/80 or less, maintaining a healthy lipid/cholesterol profile, preventing diabetes or keeping diabetes numbers under good control, not smoking or using tobacco products, exercising most days per week or at least 150 minutes per week of exercise, and eating healthy variety of fruits and vegetables, healthy oils, and avoiding unhealthy food choices like fried food, fast food, high sugar and  high cholesterol foods.    Other tests may possibly include EKG test, CT coronary calcium score, echocardiogram, exercise treadmill stress test.   Given exam findings or tobacco history, I recommend Abdominal  Aortia Aneurysm screening . Check insurance coverage for this.     Medical care options: I recommend you continue to seek care here first for routine care.  We try really hard to have available appointments Monday through Friday daytime hours for sick visits, acute visits, and physicals.  Urgent care should be used for after hours and weekends for significant issues that cannot wait till the next day.  The emergency department should be used for significant potentially life-threatening emergencies.  The emergency department is expensive, can often have long wait times for less significant concerns, so try to utilize primary care, urgent care, or telemedicine when possible to avoid unnecessary trips to the emergency department.  Virtual visits and telemedicine have been introduced since the pandemic started in 2020, and can be convenient ways to receive medical care.  We offer virtual appointments as well to assist you in a variety of options to seek medical care.    Separate significant issues discussed: You have recent allergy symptoms but he also have lung sounds with symptoms suggestive of maybe early bronchitis/sinus infection.  Begin Z-Pak antibiotic.  You can use albuterol inhaler which is a rescue inhaler 1 to 2 puffs every 4-6 hours as needed for shortness of breath, cough, wheezing spells.  Begin trial of Breo inhaler 1 puff daily.  Rinse mouth out with water after use.  This is a 2-week sample.  Also refilled and allergy pill you can take daily for the next 2 months for allergies.  Smoker -I strongly recommend you quit smoking  Obesity-I recommend you continue efforts to lose weight through healthy diet and exercise  Hypertension-you declined medicine at this time.  I recommend you limit salt, continue exercise, work on efforts to lose weight.  Let us recheck within 3 months.  If not at goal at that time we will need to start blood pressure medication  Snoring, high blood pressure-consider  sleep study as we discussed to evaluate for sleep apnea  High cholesterol-recheck labs today, avoid lots of meat and processed foods and foods made with butter such as cookies and cakes  Varicose veins-mild.  Consider wearing compression socks daily

## 2020-11-23 NOTE — Progress Notes (Signed)
Subjective:   HPI  Dennis Fox is a 55 y.o. male who presents for Chief Complaint  Patient presents with  . Annual Exam    Pt. Present for fasting cpe.    Patient Care Team: Yesenia Locurto, Cleda Mccreedy as PCP - General (Family Medicine) Sees dentist Sees eye doctor Dr. Nita Sells, dermatology  Concerns: Allergies messing with him.   Real congested.  10 days.  Has runny nose , sneezing, chest congestion, goopy eyes in morning.   Daughter and wife have similar.   Coughing.  Getting up some phlegm.   Prone to bronchitis.  Feels some sob.   tobacco use - smokes 1ppd, x 25+ years.  Saw dermatology this morning, had biopsy of lesion of right lower leg.  Reviewed their medical, surgical, family, social, medication, and allergy history and updated chart as appropriate.  Past Medical History:  Diagnosis Date  . Allergy   . Dyslipidemia   . GERD (gastroesophageal reflux disease)   . Tobacco use disorder     Past Surgical History:  Procedure Laterality Date  . EYE SURGERY     pterygium  . KNEE SURGERY     age 60yo    Family History  Problem Relation Age of Onset  . Heart disease Mother 30       CABG  . Cancer Mother        non hodkins lymphoma  . Heart disease Father 70       CABG  . COPD Father   . Pneumonia Father        died of PNA  . Diabetes Father   . Cancer Maternal Uncle        uncle  . Cancer Paternal Uncle   . Stroke Neg Hx     No current outpatient medications on file.  No Known Allergies   Review of Systems Constitutional: -fever, -chills, -sweats, -unexpected weight change, -decreased appetite, -fatigue Allergy: -sneezing, -itching, -congestion Dermatology: -changing moles, --rash, -lumps ENT: -runny nose, -ear pain, +sore throat, -hoarseness, +sinus pain, -teeth pain, - ringing in ears, -hearing loss, -nosebleeds Cardiology: -chest pain, -palpitations, -swelling, -difficulty breathing when lying flat, -waking up short of breath Respiratory:  +cough, +shortness of breath, -difficulty breathing with exercise or exertion, -wheezing, -coughing up blood Gastroenterology: -abdominal pain, -nausea, -vomiting, -diarrhea, -constipation, -blood in stool, -changes in bowel movement, -difficulty swallowing or eating Hematology: -bleeding, -bruising  Musculoskeletal: -joint aches, -muscle aches, -joint swelling, -back pain, -neck pain, -cramping, -changes in gait Ophthalmology: denies vision changes, eye redness, itching, discharge Urology: -burning with urination, -difficulty urinating, -blood in urine, -urinary frequency, -urgency, -incontinence Neurology: -headache, -weakness, -tingling, -numbness, -memory loss, -falls, -dizziness Psychology: -depressed mood, -agitation, -sleep problems Male GU: no testicular mass, pain, no lymph nodes swollen, no swelling, no rash.     Objective:  BP (!) 150/100   Pulse 64   Ht 5\' 8"  (1.727 m)   Wt 211 lb 3.2 oz (95.8 kg)   SpO2 98%   BMI 32.11 kg/m    Wt Readings from Last 3 Encounters:  11/23/20 211 lb 3.2 oz (95.8 kg)  11/02/20 215 lb (97.5 kg)  04/12/20 214 lb (97.1 kg)   BP Readings from Last 3 Encounters:  11/23/20 (!) 150/100  11/02/20 (!) 150/80  07/09/20 115/76    General appearance: alert, no distress, WD/WN, Caucasian male Skin: scattered macules, bandage over biopsy wound right leg HEENT: normocephalic, conjunctiva/corneas normal, sclerae anicteric, PERRLA, EOMi, nares with turbinated edema, pinkish coloration, no discharge  or erythema, pharynx with mild erythema Oral cavity: MMM, tongue normal, teeth in good repair Neck: supple, no lymphadenopathy, no thyromegaly, no masses, normal ROM, no bruits Chest: non tender, normal shape and expansion Heart: RRR, normal S1, S2, no murmurs Lungs: CTA bilaterally, no wheezes, rhonchi, or rales Abdomen: +bs, soft, non tender, non distended, no masses, no hepatomegaly, no splenomegaly, no bruits Back: non tender, normal ROM, no  scoliosis Musculoskeletal: upper extremities non tender, no obvious deformity, normal ROM throughout, lower extremities non tender, no obvious deformity, normal ROM throughout Extremities: mild varicose veins LE, no edema, no cyanosis, no clubbing Pulses: 2+ symmetric, upper and lower extremities, normal cap refill Neurological: alert, oriented x 3, CN2-12 intact, strength normal upper extremities and lower extremities, sensation normal throughout, DTRs 2+ throughout, no cerebellar signs, gait normal Psychiatric: normal affect, behavior normal, pleasant  GU: normal male external genitalia,circumcised, nontender, no masses, no hernia, no lymphadenopathy Rectal: anus normal tone, prostate wnl  Assessment and Plan :   Encounter Diagnoses  Name Primary?  . Annual physical exam Yes  . Seasonal allergic rhinitis, unspecified trigger   . BMI 32.0-32.9,adult   . Colon cancer screening   . Essential hypertension, benign   . Family history of prostate cancer   . Gastroesophageal reflux disease, unspecified whether esophagitis present   . Hyperlipidemia LDL goal <70   . Tobacco use disorder   . Snoring   . Encounter for hepatitis C screening test for low risk patient   . Screening for prostate cancer     This visit was a preventative care visit, also known as wellness visit or routine physical.   Topics typically include healthy lifestyle, diet, exercise, preventative care, vaccinations, sick and well care, proper use of emergency dept and after hours care, as well as other concerns.     Recommendations: Continue to return yearly for your annual wellness and preventative care visits.  This gives [place] a chance to discuss healthy lifestyle, exercise, vaccinations, review your chart record, and perform screenings where appropriate.  I recommend you see your eye doctor yearly for routine vision care.  I recommend you see your dentist yearly for routine dental care including hygiene visits twice  yearly.   Vaccination recommendations were reviewed  There is no immunization history on file for this patient.   Vaccine recommendations include the following: Yearly flu shot in the fall Tetanus booster every 10 years COVID vaccine Shingles vaccine Possibly pneumococcal pneumonia vaccine   I recommend you call insurance to inquire about coverage for these vaccines and plan to update these.  We would need to give you a schedule on updating these vaccines appropriately     Screening for cancer: Colon cancer screening: Pending result from [facility]  We discussed PSA, prostate exam, and prostate cancer screening risks/benefits.     Skin cancer screening: Check your skin regularly for new changes, growing lesions, or other lesions of concern Come in for evaluation if you have skin lesions of concern.  Lung cancer screening: If you have a greater than 20 pack year history of tobacco use, then you may qualify for lung cancer screening with a chest CT scan.   Please call your insurance company to inquire about coverage for this test.  We currently don't have screenings for other cancers besides breast, cervical, colon, and lung cancers.  If you have a strong family history of cancer or have other cancer screening concerns, please let me know.    Bone health: Get at least  150 minutes of aerobic exercise weekly Get weight bearing exercise at least once weekly Bone density test:   A bone density test is an imaging test that uses a type of X-ray to measure the amount of calcium and other minerals in your bones.  The test may be used to diagnose or screen you for a condition that causes weak or thin bones (osteoporosis), predict your risk for a broken bone (fracture), or determine how well your osteoporosis treatment is working. The bone density test is recommended for females 65 and older, or females or males <65 if certain risk factors such as thyroid disease, long term use of  steroids such as for asthma or rheumatological issues, vitamin D deficiency, estrogen deficiency, family history of osteoporosis, self or family history of fragility fracture in first degree relative.    Heart health: Get at least 150 minutes of aerobic exercise weekly Limit alcohol It is important to maintain a healthy blood pressure and healthy cholesterol numbers  Heart disease screening: Screening for heart disease includes screening for blood pressure, fasting lipids, glucose/diabetes screening, BMI height to weight ratio, reviewed of smoking status, physical activity, and diet.    Goals include blood pressure 120/80 or less, maintaining a healthy lipid/cholesterol profile, preventing diabetes or keeping diabetes numbers under good control, not smoking or using tobacco products, exercising most days per week or at least 150 minutes per week of exercise, and eating healthy variety of fruits and vegetables, healthy oils, and avoiding unhealthy food choices like fried food, fast food, high sugar and high cholesterol foods.    Other tests may possibly include EKG test, CT coronary calcium score, echocardiogram, exercise treadmill stress test.   Given exam findings or tobacco history, I recommend Abdominal Aortia Aneurysm screening . Check insurance coverage for this.     Medical care options: I recommend you continue to seek care here first for routine care.  We try really hard to have available appointments Monday through Friday daytime hours for sick visits, acute visits, and physicals.  Urgent care should be used for after hours and weekends for significant issues that cannot wait till the next day.  The emergency department should be used for significant potentially life-threatening emergencies.  The emergency department is expensive, can often have long wait times for less significant concerns, so try to utilize primary care, urgent care, or telemedicine when possible to avoid unnecessary  trips to the emergency department.  Virtual visits and telemedicine have been introduced since the pandemic started in 2020, and can be convenient ways to receive medical care.  We offer virtual appointments as well to assist you in a variety of options to seek medical care.    Separate significant issues discussed: You have recent allergy symptoms but he also have lung sounds with symptoms suggestive of maybe early bronchitis/sinus infection.  Begin Z-Pak antibiotic.  You can use albuterol inhaler which is a rescue inhaler 1 to 2 puffs every 4-6 hours as needed for shortness of breath, cough, wheezing spells.  Begin trial of Breo inhaler 1 puff daily.  Rinse mouth out with water after use.  This is a 2-week sample.  Also refilled and allergy pill you can take daily for the next 2 months for allergies.  Smoker -I strongly recommend you quit smoking.  PFT reviewed today but unable to give adequate effort  Obesity-I recommend you continue efforts to lose weight through healthy diet and exercise  Hypertension-you declined medicine at this time.  I recommend you  limit salt, continue exercise, work on efforts to lose weight.  Let us recheck within 3 months.  If not at goal at that time we will need to start blood pressure medication  Snoring, high blood pressure-consider sleep study as we discussed to evaluate for sleep apnea  High cholesterol-recheck labs today, avoid lots of meat and processed foods and foods made with butter such as cookies and cakes  Varicose veins-mild.  Consider wearing compression socks daily   Jalani was seen today for annual exam.  Diagnoses and all orders for this visit:  Annual physical exam -     PSA -     Comprehensive metabolic panel -     Lipid panel -     CBC with Differential/Platelet -     Hepatitis C antibody  Seasonal allergic rhinitis, unspecified trigger  BMI 32.0-32.9,adult  Colon cancer screening  Essential hypertension, benign  Family history  of prostate cancer -     PSA  Gastroesophageal reflux disease, unspecified whether esophagitis present  Hyperlipidemia LDL goal <70 -     Lipid panel  Tobacco use disorder  Snoring  Encounter for hepatitis C screening test for low risk patient -     Hepatitis C antibody  Screening for prostate cancer -     PSA   Follow-up pending labs, yearly for physical

## 2020-11-24 LAB — CBC WITH DIFFERENTIAL/PLATELET
Basophils Absolute: 0.1 10*3/uL (ref 0.0–0.2)
Basos: 1 %
EOS (ABSOLUTE): 0.2 10*3/uL (ref 0.0–0.4)
Eos: 3 %
Hematocrit: 47.4 % (ref 37.5–51.0)
Hemoglobin: 16.2 g/dL (ref 13.0–17.7)
Immature Grans (Abs): 0.1 10*3/uL (ref 0.0–0.1)
Immature Granulocytes: 1 %
Lymphocytes Absolute: 2 10*3/uL (ref 0.7–3.1)
Lymphs: 27 %
MCH: 33.1 pg — ABNORMAL HIGH (ref 26.6–33.0)
MCHC: 34.2 g/dL (ref 31.5–35.7)
MCV: 97 fL (ref 79–97)
Monocytes Absolute: 0.5 10*3/uL (ref 0.1–0.9)
Monocytes: 7 %
Neutrophils Absolute: 4.7 10*3/uL (ref 1.4–7.0)
Neutrophils: 61 %
Platelets: 222 10*3/uL (ref 150–450)
RBC: 4.89 x10E6/uL (ref 4.14–5.80)
RDW: 11.4 % — ABNORMAL LOW (ref 11.6–15.4)
WBC: 7.7 10*3/uL (ref 3.4–10.8)

## 2020-11-24 LAB — LIPID PANEL
Chol/HDL Ratio: 4.3 ratio (ref 0.0–5.0)
Cholesterol, Total: 240 mg/dL — ABNORMAL HIGH (ref 100–199)
HDL: 56 mg/dL (ref >39)
LDL Chol Calc (NIH): 163 mg/dL — ABNORMAL HIGH (ref 0–99)
Triglycerides: 116 mg/dL (ref 0–149)
VLDL Cholesterol Cal: 21 mg/dL (ref 5–40)

## 2020-11-24 LAB — COMPREHENSIVE METABOLIC PANEL
ALT: 20 IU/L (ref 0–44)
AST: 20 IU/L (ref 0–40)
Albumin/Globulin Ratio: 1.8 (ref 1.2–2.2)
Albumin: 4.6 g/dL (ref 3.8–4.9)
Alkaline Phosphatase: 67 IU/L (ref 44–121)
BUN/Creatinine Ratio: 17 (ref 9–20)
BUN: 17 mg/dL (ref 6–24)
Bilirubin Total: 1 mg/dL (ref 0.0–1.2)
CO2: 22 mmol/L (ref 20–29)
Calcium: 9.6 mg/dL (ref 8.7–10.2)
Chloride: 101 mmol/L (ref 96–106)
Creatinine, Ser: 0.99 mg/dL (ref 0.76–1.27)
Globulin, Total: 2.5 g/dL (ref 1.5–4.5)
Glucose: 93 mg/dL (ref 65–99)
Potassium: 4.6 mmol/L (ref 3.5–5.2)
Sodium: 139 mmol/L (ref 134–144)
Total Protein: 7.1 g/dL (ref 6.0–8.5)
eGFR: 90 mL/min/{1.73_m2} (ref >59)

## 2020-11-24 LAB — PSA: Prostate Specific Ag, Serum: 0.4 ng/mL (ref 0.0–4.0)

## 2020-11-24 LAB — HEPATITIS C ANTIBODY: Hep C Virus Ab: <0.1 s/co ratio (ref 0.0–0.9)

## 2020-11-27 ENCOUNTER — Other Ambulatory Visit: Payer: Self-pay | Admitting: Medical

## 2020-11-27 MED ORDER — ROSUVASTATIN CALCIUM 10 MG PO TABS: 10.0000 mg | ORAL_TABLET | Freq: Every day | ORAL | 3 refills | Status: DC

## 2020-11-30 ENCOUNTER — Encounter: Payer: Self-pay | Admitting: Medical

## 2020-12-20 ENCOUNTER — Other Ambulatory Visit: Payer: Self-pay | Admitting: Medical

## 2020-12-27 ENCOUNTER — Other Ambulatory Visit: Payer: Self-pay | Admitting: Medical

## 2020-12-27 ENCOUNTER — Other Ambulatory Visit: Payer: Self-pay

## 2020-12-27 ENCOUNTER — Ambulatory Visit
Admission: RE | Admit: 2020-12-27 | Discharge: 2020-12-27 | Disposition: A | Discharge status: Home / Self Care | Payer: BC Managed Care – PPO | Source: Ambulatory Visit | Attending: Medical | Admitting: Medical

## 2020-12-27 ENCOUNTER — Telehealth: Payer: Self-pay | Admitting: Family Medicine

## 2020-12-27 DIAGNOSIS — F17200 Nicotine dependence, unspecified, uncomplicated: Secondary | ICD-10-CM

## 2020-12-27 DIAGNOSIS — R059 Cough, unspecified: Secondary | ICD-10-CM

## 2020-12-27 DIAGNOSIS — R0602 Shortness of breath: Secondary | ICD-10-CM | POA: Diagnosis not present

## 2020-12-27 MED ORDER — AZITHROMYCIN 250 MG PO TABS: ORAL_TABLET | ORAL | 0 refills | Status: DC

## 2020-12-27 NOTE — Telephone Encounter (Signed)
Antibiotic sent Please go to [facility] for your chest xray.   Their hours are 8am - 4:30 pm Monday - Friday.  Take your insurance card with you.  [facility] [phone]  [address], [address] [place], [place] [address]  [address] [place], [place] [address]    Make f/u appt in a week to recheck on symptoms, chest xray review

## 2020-12-27 NOTE — Telephone Encounter (Signed)
Pt called and states sinus infection is back, he would like another round or antibiotic( [facility] [address]) and go ahead with chest xray, since he is a smoker.  Please let pt know.

## 2020-12-27 NOTE — Telephone Encounter (Signed)
Called pt advised him to go to [facility].  He has already picked up antx.  He will follow up here in 1 week.

## 2021-01-04 ENCOUNTER — Other Ambulatory Visit: Payer: Self-pay

## 2021-01-04 ENCOUNTER — Encounter: Payer: Self-pay | Admitting: Medical

## 2021-01-04 ENCOUNTER — Ambulatory Visit: Payer: BC Managed Care – PPO | Admitting: Medical

## 2021-01-04 VITALS — BP 140/80 | HR 44 | Ht 68.0 in | Wt 214.8 lb

## 2021-01-04 DIAGNOSIS — R918 Other nonspecific abnormal finding of lung field: Secondary | ICD-10-CM

## 2021-01-04 DIAGNOSIS — F17200 Nicotine dependence, unspecified, uncomplicated: Secondary | ICD-10-CM | POA: Diagnosis not present

## 2021-01-04 DIAGNOSIS — B351 Tinea unguium: Secondary | ICD-10-CM

## 2021-01-04 DIAGNOSIS — J321 Chronic frontal sinusitis: Secondary | ICD-10-CM

## 2021-01-04 DIAGNOSIS — E785 Hyperlipidemia, unspecified: Secondary | ICD-10-CM | POA: Diagnosis not present

## 2021-01-04 MED ORDER — PREDNISONE 10 MG PO TABS: ORAL_TABLET | ORAL | 0 refills | Status: DC

## 2021-01-04 MED ORDER — AMOXICILLIN-POT CLAVULANATE 875-125 MG PO TABS: 1.0000 | ORAL_TABLET | Freq: Two times a day (BID) | ORAL | 0 refills | Status: DC

## 2021-01-04 MED ORDER — TERBINAFINE HCL 250 MG PO TABS: 250.0000 mg | ORAL_TABLET | Freq: Every day | ORAL | 0 refills | Status: DC

## 2021-01-04 NOTE — Progress Notes (Signed)
Subjective: Chief Complaint  Patient presents with  . Follow-up    Follow up on sinus infection    Here for ongoing problems with sinuses.  Not  Having green mucous now.Dennis Fox  He called in recently for ongoing sinus issues and a Z-Pak was called out.  He does not feel much improved.  No fever, no sore throat.  Having some cough.  Getting some white chunks of mucous with cough and sinuses.  No nausea, no vomiting.  Using nothing else for this.  Using zyrtec.   No nasal saline  He does continue to smoke   Hyperlipidemia-she started the Crestor from her recent physical visit in April  He has concerns about toenail fungus.  He has thickened yellowish nails and some nails actually ache.  No prior oral treatment.  Has tried several over-the-counter topicals without improvement  Past Medical History:  Diagnosis Date  . Allergy   . Dyslipidemia   . GERD (gastroesophageal reflux disease)   . Tobacco use disorder    Current Outpatient Medications on File Prior to Visit  Medication Sig Dispense Refill  . albuterol (VENTOLIN HFA) 108 (90 Base) MCG/ACT inhaler TAKE 2 PUFFS BY MOUTH EVERY 6 HOURS AS NEEDED FOR WHEEZE OR SHORTNESS OF BREATH 8.5 each 0  . cetirizine (ZYRTEC) 10 MG tablet Take 1 tablet (10 mg total) by mouth at bedtime. 30 tablet 3  . rosuvastatin (CRESTOR) 10 MG tablet Take 1 tablet (10 mg total) by mouth daily. 90 tablet 3  . azithromycin (ZITHROMAX) 250 MG tablet 2 tablets day 1, then 1 tablet days 2-4 (Patient not taking: Reported on 01/04/2021) 6 tablet 0  . fluticasone furoate-vilanterol (BREO ELLIPTA) 100-25 MCG/INH AEPB Inhale 1 puff into the lungs daily at 6 (six) AM. (Patient not taking: Reported on 01/04/2021) 1 each 0   No current facility-administered medications on file prior to visit.   ROS as in subjective    Objective: BP 140/80   Pulse (!) 44   Ht 5\' 8"  (1.727 m)   Wt 214 lb 12.8 oz (97.4 kg)   SpO2 93%   BMI 32.66 kg/m   Gen: wd, wn, nad Fluid behind  bilateral TMs, flat TMs, mild erythema, mild sinus pressure, otherwise HEENT unremarkable Lungs: Some rhonchi, some bronchial sounds otherwise clear, no wheezing Heart regular rate and rhythm, normal S1-S2 no murmurs Thickened yellow toenails throughout particular the great toenails Otherwise lower extremities unremarkable    Assessment: Encounter Diagnoses  Name Primary?  . Chronic frontal sinusitis Yes  . Abnormal lung field   . Smoker   . Hyperlipidemia, unspecified hyperlipidemia type   . Onychomycosis       Plan Chronic sinus infection-begin Augmentin and prednisone.  If not much improved within 2 weeks consider ENT consult or sinus CT  Smoker, abnormal lung field-advised to quit smoking.  I reviewed his May 2022 chest x-ray that was normal  Hyperlipidemia-continue statin recently started.  Follow-up in 6 weeks for fasting labs  Onychomycosis-begin Lamisil oral for toenail fungus.  Discussed risk and benefits and proper use of medication.  We discussed that this treatment is a slow process and cannot expect improvement for at least 4 weeks.  follow-up in 6 weeks fasting for recheck.    [name] was seen today for follow-up.  Diagnoses and all orders for this visit:  Chronic frontal sinusitis  Abnormal lung field  Smoker  Hyperlipidemia, unspecified hyperlipidemia type  Onychomycosis  Other orders -     amoxicillin-clavulanate (AUGMENTIN) 875-125 MG  tablet; Take 1 tablet by mouth 2 (two) times daily. -     predniSONE (DELTASONE) 10 MG tablet; 6 tablets day 1, 5 tablets day 2, 4 tablets day 3, 3 tablets day 4, 2 tablets day 5, 1 tablet day 6 -     terbinafine (LAMISIL) 250 MG tablet; Take 1 tablet (250 mg total) by mouth daily.  f/u 6 weeks fasting

## 2021-01-27 ENCOUNTER — Other Ambulatory Visit: Payer: Self-pay | Admitting: Medical

## 2021-02-06 ENCOUNTER — Other Ambulatory Visit: Payer: Self-pay | Admitting: Medical

## 2021-03-01 ENCOUNTER — Ambulatory Visit: Payer: BC Managed Care – PPO | Admitting: Medical

## 2021-03-08 ENCOUNTER — Other Ambulatory Visit: Payer: Self-pay

## 2021-03-08 ENCOUNTER — Ambulatory Visit: Payer: BC Managed Care – PPO | Admitting: Medical

## 2021-03-08 VITALS — BP 120/72 | HR 54 | Wt 211.8 lb

## 2021-03-08 DIAGNOSIS — I10 Essential (primary) hypertension: Secondary | ICD-10-CM | POA: Diagnosis not present

## 2021-03-08 DIAGNOSIS — E785 Hyperlipidemia, unspecified: Secondary | ICD-10-CM | POA: Diagnosis not present

## 2021-03-08 DIAGNOSIS — B351 Tinea unguium: Secondary | ICD-10-CM

## 2021-03-08 DIAGNOSIS — F17200 Nicotine dependence, unspecified, uncomplicated: Secondary | ICD-10-CM

## 2021-03-08 DIAGNOSIS — Z79899 Other long term (current) drug therapy: Secondary | ICD-10-CM | POA: Diagnosis not present

## 2021-03-08 NOTE — Progress Notes (Signed)
Subjective:  Dennis Fox is a 55 y.o. male who presents for Chief Complaint  Patient presents with   Follow-up    Follow-up on crestor. Did not fast this morning as he had breakfast     Here today to follow-up on med changes.  Last visit we started on statin for hyperlipidemia.  He is taking the medication Crestor without complaint.  No side effects reported  Diet - in general avoids junk food.  He does eat some snickers, and typically eats a steak every 2 weeks.  Doesn't eat a lot of red meat.  Does eat a fair amount of chicken  Last visit we started Lamisil oral for toenail fungus.  So far he notes half the toenails are looking like good nail coming in.  He plans to use some essential oils topical as well.  Hasn't started this yet.  HTN - he notes he has been more careful with diet.   Home BP readings have been looking good.    No other aggravating or relieving factors.    No other c/o.  Past Medical History:  Diagnosis Date   Allergy    Dyslipidemia    GERD (gastroesophageal reflux disease)    Tobacco use disorder    Current Outpatient Medications on File Prior to Visit  Medication Sig Dispense Refill   albuterol (VENTOLIN HFA) 108 (90 Base) MCG/ACT inhaler TAKE 2 PUFFS BY MOUTH EVERY 6 HOURS AS NEEDED FOR WHEEZE OR SHORTNESS OF BREATH 8.5 each 0   cetirizine (ZYRTEC) 10 MG tablet TAKE 1 TABLET BY MOUTH EVERYDAY AT BEDTIME 90 tablet 0   rosuvastatin (CRESTOR) 10 MG tablet Take 1 tablet (10 mg total) by mouth daily. 90 tablet 3   terbinafine (LAMISIL) 250 MG tablet TAKE 1 TABLET BY MOUTH EVERY DAY 30 tablet 1   No current facility-administered medications on file prior to visit.     The following portions of the patient's history were reviewed and updated as appropriate: allergies, current medications, past family history, past medical history, past social history, past surgical history and problem list.  ROS Otherwise as in subjective above  Objective: BP 120/72    Pulse (!) 54   Wt 211 lb 12.8 oz (96.1 kg)   SpO2 97%   BMI 32.20 kg/m   General appearance: alert, no distress, well developed, well nourished Skin: toenails show clear/normal nail approx half the nail.  Distal nails still thickened and discolored Pulses: 2+ radial pulses, 2+ pedal pulses, normal cap refill Ext: no edema   Assessment: Encounter Diagnoses  Name Primary?   Hyperlipidemia LDL goal <70 Yes   Smoker    Onychomycosis    Essential hypertension, benign    High risk medication use      Plan: Hyperlipidemia - he is compliant with statin from last visit.   He is not fasting today.   He has made some dietary changes.   He will return fasting for nurse visit for lipid lab  Advised smoking cessation  onychomycosis - much improved.  Finish out current few weeks of Lamisil oral. Lab today for surveillance of liver/kidney  Dennis Fox was seen today for follow-up.  Diagnoses and all orders for this visit:  Hyperlipidemia LDL goal <70 -     Lipid panel; Future  Smoker  Onychomycosis -     Comprehensive metabolic panel  Essential hypertension, benign -     Lipid panel; Future -     Comprehensive metabolic panel  High risk medication use -  Lipid panel; Future -     Comprehensive metabolic panel   Follow up: pending labs, return for fasting lipid panel at his convenience

## 2021-03-09 LAB — COMPREHENSIVE METABOLIC PANEL
ALT: 22 IU/L (ref 0–44)
AST: 16 IU/L (ref 0–40)
Albumin/Globulin Ratio: 1.8 (ref 1.2–2.2)
Albumin: 4.5 g/dL (ref 3.8–4.9)
Alkaline Phosphatase: 70 IU/L (ref 44–121)
BUN/Creatinine Ratio: 13 (ref 9–20)
BUN: 15 mg/dL (ref 6–24)
Bilirubin Total: 0.5 mg/dL (ref 0.0–1.2)
CO2: 22 mmol/L (ref 20–29)
Calcium: 9.7 mg/dL (ref 8.7–10.2)
Chloride: 104 mmol/L (ref 96–106)
Creatinine, Ser: 1.12 mg/dL (ref 0.76–1.27)
Globulin, Total: 2.5 g/dL (ref 1.5–4.5)
Glucose: 90 mg/dL (ref 65–99)
Potassium: 4.4 mmol/L (ref 3.5–5.2)
Sodium: 140 mmol/L (ref 134–144)
Total Protein: 7 g/dL (ref 6.0–8.5)
eGFR: 78 mL/min/{1.73_m2} (ref >59)

## 2021-03-22 ENCOUNTER — Other Ambulatory Visit: Payer: Self-pay

## 2021-03-22 ENCOUNTER — Other Ambulatory Visit: Payer: BC Managed Care – PPO

## 2021-03-22 DIAGNOSIS — Z79899 Other long term (current) drug therapy: Secondary | ICD-10-CM | POA: Diagnosis not present

## 2021-03-22 DIAGNOSIS — E785 Hyperlipidemia, unspecified: Secondary | ICD-10-CM

## 2021-03-22 DIAGNOSIS — I10 Essential (primary) hypertension: Secondary | ICD-10-CM

## 2021-03-23 LAB — LIPID PANEL
Chol/HDL Ratio: 3.6 ratio (ref 0.0–5.0)
Cholesterol, Total: 193 mg/dL (ref 100–199)
HDL: 54 mg/dL (ref >39)
LDL Chol Calc (NIH): 121 mg/dL — ABNORMAL HIGH (ref 0–99)
Triglycerides: 97 mg/dL (ref 0–149)
VLDL Cholesterol Cal: 18 mg/dL (ref 5–40)

## 2021-03-24 ENCOUNTER — Other Ambulatory Visit: Payer: Self-pay | Admitting: Medical

## 2021-03-24 MED ORDER — ROSUVASTATIN CALCIUM 20 MG PO TABS
20.0000 mg | ORAL_TABLET | Freq: Every day | ORAL | 3 refills | Status: DC
Start: 2021-03-24 — End: 2022-02-19

## 2021-04-18 ENCOUNTER — Other Ambulatory Visit: Payer: Self-pay | Admitting: Medical

## 2021-05-06 ENCOUNTER — Other Ambulatory Visit: Payer: Self-pay | Admitting: Medical

## 2021-05-25 ENCOUNTER — Other Ambulatory Visit: Payer: Self-pay | Admitting: Medical

## 2021-06-27 DIAGNOSIS — D3131 Benign neoplasm of right choroid: Secondary | ICD-10-CM | POA: Diagnosis not present

## 2021-08-08 ENCOUNTER — Other Ambulatory Visit: Payer: Self-pay | Admitting: Medical

## 2021-08-27 ENCOUNTER — Encounter: Payer: Self-pay | Admitting: Family Medicine

## 2021-08-27 ENCOUNTER — Telehealth: Payer: BC Managed Care – PPO | Admitting: Family Medicine

## 2021-08-27 ENCOUNTER — Other Ambulatory Visit: Payer: Self-pay

## 2021-08-27 VITALS — Temp 97.5°F | Wt 205.0 lb

## 2021-08-27 DIAGNOSIS — U071 COVID-19: Secondary | ICD-10-CM

## 2021-08-27 NOTE — Progress Notes (Signed)
° °  Subjective:    Patient ID: Dennis Fox, male    DOB: 01-06-1966, 56 y.o.   MRN: [id number]  HPI Documentation for virtual audio and video telecommunications through [place] encounter: The patient was located at home. 2 patient identifiers used.  The provider was located in the office. The patient did consent to this visit and is aware of possible charges through their insurance for this visit. The other persons participating in this telemedicine service were none. Time spent on call was 5 minutes and in review of previous records >20 minutes total for counseling and coordination of care. This virtual service is not related to other E/M service within previous 7 days.  He states that last Saturday he developed some myalgias followed the next day by malaise.  On Monday he was able to play golf but later that day noted some nasal congestion.  He then was tested and is positive.  He has had no previous vaccines.  Presently he again is just having myalgias and malaise with nasal congestion but no cough, fever, shortness of breath.  Review of Systems     Objective:   Physical Exam Alert and in no distress otherwise not examined       Assessment & Plan:  COVID-19 I explained that he has a 5-day window and he needs to stay sequestered until Friday then eating become more active but needs to wear a mask for another 5 days.  If his symptoms worsen in regard to fever, coughing, shortness of breath he is to call me back.  Discussed follow-up vaccine in 3 months but he is not interested.

## 2021-09-30 ENCOUNTER — Telehealth: Payer: Self-pay | Admitting: Family Medicine

## 2021-09-30 NOTE — Telephone Encounter (Signed)
Dennis Fox called and left message that he wants a HOME sleep study.  Please call him at [phone]

## 2021-10-01 NOTE — Telephone Encounter (Signed)
Pt called to check on status.

## 2021-11-07 ENCOUNTER — Telehealth: Payer: Self-pay | Admitting: Internal Medicine

## 2021-11-07 NOTE — Telephone Encounter (Signed)
Advised pt he needs an appt to discuss his sleep study with Vincenza Hews as he has severe sleep apnea. Pt will call back to schedule this when he looks at calendar ? ?I will go ahead and place referral to [facility] for cpap supplies ?

## 2021-11-10 ENCOUNTER — Other Ambulatory Visit: Payer: Self-pay | Admitting: Medical

## 2021-11-11 NOTE — Telephone Encounter (Signed)
If he is still taking or planning to take this medicine we need to do a follow-up ,reexamine, and repeat labs ?

## 2021-11-11 NOTE — Telephone Encounter (Signed)
Left message for pt to call me back 

## 2021-11-13 ENCOUNTER — Ambulatory Visit (INDEPENDENT_AMBULATORY_CARE_PROVIDER_SITE_OTHER): Payer: BC Managed Care – PPO | Admitting: Medical

## 2021-11-13 ENCOUNTER — Other Ambulatory Visit: Payer: Self-pay

## 2021-11-13 VITALS — BP 120/72 | HR 63 | Wt 213.2 lb

## 2021-11-13 DIAGNOSIS — I10 Essential (primary) hypertension: Secondary | ICD-10-CM

## 2021-11-13 DIAGNOSIS — Z6832 Body mass index (BMI) 32.0-32.9, adult: Secondary | ICD-10-CM

## 2021-11-13 DIAGNOSIS — G4734 Idiopathic sleep related nonobstructive alveolar hypoventilation: Secondary | ICD-10-CM

## 2021-11-13 DIAGNOSIS — R0683 Snoring: Secondary | ICD-10-CM | POA: Diagnosis not present

## 2021-11-13 DIAGNOSIS — G4733 Obstructive sleep apnea (adult) (pediatric): Secondary | ICD-10-CM | POA: Diagnosis not present

## 2021-11-13 DIAGNOSIS — F17200 Nicotine dependence, unspecified, uncomplicated: Secondary | ICD-10-CM | POA: Diagnosis not present

## 2021-11-13 DIAGNOSIS — J302 Other seasonal allergic rhinitis: Secondary | ICD-10-CM

## 2021-11-13 MED ORDER — CETIRIZINE HCL 10 MG PO TABS: ORAL_TABLET | ORAL | 3 refills | Status: DC

## 2021-11-13 NOTE — Progress Notes (Signed)
Subjective: ? Dennis Fox is a 56 y.o. male who presents for ?Chief Complaint  ?Patient presents with  ? discuss sleep study  ?  Discuss sleep study  ?   ?Here for discussion of sleep.  We recently ordered a sleep study given his fatigue, snoring, nonrestful sleep.  Wife is on CPAP as well and she has complained of his snoring.  He had a feeling of is going to be pretty bad findings.  After his results we immediately referred to home health to get CPAP in process.  He has heard from [facility] but they are waiting on CPAP supplies now.  No other aggravating or relieving factors.   ? ?No other c/o. ? ?The following portions of the patient's history were reviewed and updated as appropriate: allergies, current medications, past family history, past medical history, past social history, past surgical history and problem list. ? ?ROS ?Otherwise as in subjective above ? ?Objective: ?BP 120/72   Pulse 63   Wt 213 lb 3.2 oz (96.7 kg)   BMI 32.42 kg/m?  ? ?General appearance: alert, no distress, well developed, well nourished ? ? ?Assessment: ?Encounter Diagnoses  ?Name Primary?  ? OSA (obstructive sleep apnea) Yes  ? Nocturnal hypoxemia   ? Snoring   ? Smoker   ? Essential hypertension, benign   ? BMI 32.0-32.9,adult   ? Seasonal allergic rhinitis, unspecified trigger   ? ? ? ?Plan: ?We discussed his sleep study results showing AHI of 57.1, as well as time less than 88% oxygen 154 minutes or 58% of his sleep time.  We discussed the severity of these findings.  We discussed the need to work on weight loss through healthy diet and exercise.  We discussed not sleeping on his back, trying to elevate the head of the bed.  We discussed regular exercise.   ? ?Avoid alcohol or sedating medications in the evening or close to bedtime. ? ?We discussed treatment options including oral mouthpiece device, CPAP, BiPAP, lifestyle changes, weight loss, elevation of head of bed, inspire procedure.   ? ?Begin trial of CPAP.  We discussed  that if he does not see significant improvements on CPAP alone we may need to consider oxygen therapy or referral to pulmonology for other potential remedies ? ?Hypertension-continue current medication ? ?Needs to quit smoking ? ?Allergic rhinitis-refilled medication at his request. ? ?Dennis Fox was seen today for discuss sleep study. ? ?Diagnoses and all orders for this visit: ? ?OSA (obstructive sleep apnea) ? ?Nocturnal hypoxemia ? ?Snoring ? ?Smoker ? ?Essential hypertension, benign ? ?BMI 32.0-32.9,adult ? ?Seasonal allergic rhinitis, unspecified trigger ? ?Other orders ?-     cetirizine (ZYRTEC) 10 MG tablet; TAKE 1 TABLET BY MOUTH EVERYDAY AT BEDTIME ? ? ? ?Follow up: pending cpap trial ? ?

## 2022-01-09 ENCOUNTER — Other Ambulatory Visit: Payer: Self-pay | Admitting: Medical

## 2022-01-09 DIAGNOSIS — G4733 Obstructive sleep apnea (adult) (pediatric): Secondary | ICD-10-CM | POA: Diagnosis not present

## 2022-02-07 ENCOUNTER — Other Ambulatory Visit: Payer: Self-pay | Admitting: Medical

## 2022-02-07 NOTE — Telephone Encounter (Signed)
Increased to 20mg  back last year

## 2022-02-09 DIAGNOSIS — G4733 Obstructive sleep apnea (adult) (pediatric): Secondary | ICD-10-CM | POA: Diagnosis not present

## 2022-02-14 ENCOUNTER — Encounter: Payer: BC Managed Care – PPO | Admitting: Medical

## 2022-02-18 DIAGNOSIS — R0981 Nasal congestion: Secondary | ICD-10-CM | POA: Diagnosis not present

## 2022-02-18 DIAGNOSIS — R42 Dizziness and giddiness: Secondary | ICD-10-CM | POA: Diagnosis not present

## 2022-02-19 ENCOUNTER — Encounter: Payer: Self-pay | Admitting: Medical

## 2022-02-19 ENCOUNTER — Encounter: Payer: BC Managed Care – PPO | Admitting: Medical

## 2022-02-19 ENCOUNTER — Telehealth: Payer: BC Managed Care – PPO | Admitting: Medical

## 2022-02-19 VITALS — BP 121/83 | HR 69 | Wt 213.0 lb

## 2022-02-19 DIAGNOSIS — R509 Fever, unspecified: Secondary | ICD-10-CM | POA: Diagnosis not present

## 2022-02-19 DIAGNOSIS — B349 Viral infection, unspecified: Secondary | ICD-10-CM

## 2022-02-19 MED ORDER — MOLNUPIRAVIR EUA 200MG CAPSULE: 4.0000 | ORAL_CAPSULE | Freq: Two times a day (BID) | ORAL | 0 refills | Status: AC

## 2022-02-19 NOTE — Progress Notes (Signed)
Subjective:     Patient ID: Dennis Fox, male   DOB: 08-Aug-1966, 56 y.o.   MRN: [id number]  This visit type was conducted due to national recommendations for restrictions regarding the COVID-19 Pandemic (e.g. social distancing) in an effort to limit this patient's exposure and mitigate transmission in our community.  Due to their co-morbid illnesses, this patient is at least at moderate risk for complications without adequate follow up.  This format is felt to be most appropriate for this patient at this time.    Documentation for virtual audio and video telecommunications through [place] encounter:  The patient was located at home. The provider was located in the office. The patient did consent to this visit and is aware of possible charges through their insurance for this visit.  The other persons participating in this telemedicine service were none. Time spent on call was 20 minutes and in review of previous records 20 minutes total.  This virtual service is not related to other E/M service within previous 7 days.   HPI Chief Complaint  Patient presents with   Generalized Body Aches    Body aches, fever 99, cough, congestion, diarrhea, symptoms Monday evening-    Virtual consult for illness.  He reports body aches, fever, cough, congestion, not feeling well x3 days.   Has had some diarrhea, 3 yesterday, 2 this morning.   Fever up to 100.   No sore throat, ears feel clogged.   No nausea or vomiting.  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  Went to urgent care next days, was advised he likely had a viral flu like illness.  No COVID test was done.  He has a COVID test he is going to do today at home.  No sick contacts.  Past Medical History:  Diagnosis Date   Allergy    Dyslipidemia    GERD (gastroesophageal reflux disease)    Tobacco use disorder    Current Outpatient  Medications on File Prior to Visit  Medication Sig Dispense Refill   albuterol (VENTOLIN HFA) 108 (90 Base) MCG/ACT inhaler TAKE 2 PUFFS BY MOUTH EVERY 6 HOURS AS NEEDED FOR WHEEZE OR SHORTNESS OF BREATH 8.5 each 0   cetirizine (ZYRTEC) 10 MG tablet TAKE 1 TABLET BY MOUTH EVERYDAY AT BEDTIME 90 tablet 3   rosuvastatin (CRESTOR) 10 MG tablet TAKE 1 TABLET BY MOUTH EVERY DAY 30 tablet 0   No current facility-administered medications on file prior to visit.   Review of Systems As in subjective    Objective:   Physical Exam Due to coronavirus pandemic stay at home measures, patient visit was virtual and they were not examined in person.   BP 121/83   Pulse 69   Wt 213 lb (96.6 kg)   BMI 32.39 kg/m   Gen: Well-developed well nourished, no acute distress Ill-appearing No labored breathing or witnessed wheezing    Assessment:     Encounter Diagnoses  Name Primary?   Viral syndrome Yes   Fever and chills        Plan:     We discussed limitations of virtual consult  We discussed his symptoms and concerns.  Given that he was in the emergency department setting there is a high likelihood that he was exposed to COVID or other respiratory illness.  He will do his home COVID test today.  I sent  Molnupiravir that he  can begin if he turns out to be positive.  We discussed the benefits of the medication and the risk of the medication.  Also advised over-the-counter emergenC immune plus vitamin pack, Tylenol or ibuprofen twice a day next few days but most importantly rest and hydration.  If worse or not improving over the next 3 to 5 days, particularly if worse trouble breathing, then call recheck or go to the emergency department  [place] was seen today for generalized body aches.  Diagnoses and all orders for this visit:  Viral syndrome  Fever and chills  Other orders -     molnupiravir EUA (LAGEVRIO) 200 mg CAPS capsule; Take 4 capsules (800 mg total) by mouth 2 (two) times daily  for 5 days.    F/u prn

## 2022-02-20 ENCOUNTER — Other Ambulatory Visit: Payer: Self-pay | Admitting: Medical

## 2022-02-20 ENCOUNTER — Telehealth: Payer: Self-pay

## 2022-02-20 MED ORDER — PROMETHAZINE-DM 6.25-15 MG/5ML PO SYRP: 5.0000 mL | ORAL_SOLUTION | Freq: Four times a day (QID) | ORAL | 0 refills | Status: DC | PRN

## 2022-02-20 MED ORDER — EMERGEN-C IMMUNE PLUS PO PACK: 1.0000 | PACK | Freq: Two times a day (BID) | ORAL | 0 refills | Status: DC

## 2022-02-20 NOTE — Telephone Encounter (Signed)
Pt. Called stating that he tested negative for covid today and he had a temperature of 102.1 last night and it is 101 now today. He stated he had a virtual visit with you yesterday and was told to call back once tested for covid. He said you could call him if you needed to or if you could call in something for him to his pharmacy.

## 2022-02-20 NOTE — Telephone Encounter (Signed)
Pt called back, he is not having SOB, WHEEZING OR mucous with cough.  Just fever and sweating.  He was upset that he called at 8:30 this morning and just getting a response.  I explained Adam had been to lunch and Vincenza Hews had just responded.  Pt was not happy.  I advised pt if any of these symptoms start that you will call in antibiotic.  He said ok and hung up.

## 2022-03-08 ENCOUNTER — Other Ambulatory Visit: Payer: Self-pay | Admitting: Medical

## 2022-03-11 DIAGNOSIS — G4733 Obstructive sleep apnea (adult) (pediatric): Secondary | ICD-10-CM | POA: Diagnosis not present

## 2022-03-24 ENCOUNTER — Other Ambulatory Visit: Payer: Self-pay | Admitting: Medical

## 2022-04-11 DIAGNOSIS — G4733 Obstructive sleep apnea (adult) (pediatric): Secondary | ICD-10-CM | POA: Diagnosis not present

## 2022-05-12 DIAGNOSIS — G4733 Obstructive sleep apnea (adult) (pediatric): Secondary | ICD-10-CM | POA: Diagnosis not present

## 2022-06-04 ENCOUNTER — Ambulatory Visit: Payer: BC Managed Care – PPO | Admitting: Family Medicine

## 2022-06-04 ENCOUNTER — Encounter: Payer: Self-pay | Admitting: Family Medicine

## 2022-06-04 VITALS — BP 100/62 | HR 53 | Temp 96.8°F | Wt 219.2 lb

## 2022-06-04 DIAGNOSIS — J301 Allergic rhinitis due to pollen: Secondary | ICD-10-CM

## 2022-06-04 MED ORDER — FLUTICASONE PROPIONATE 50 MCG/ACT NA SUSP: 2.0000 | Freq: Every day | NASAL | 6 refills | Status: DC

## 2022-06-04 MED ORDER — AZITHROMYCIN 500 MG PO TABS: 500.0000 mg | ORAL_TABLET | Freq: Every day | ORAL | 0 refills | Status: DC

## 2022-06-04 NOTE — Progress Notes (Signed)
   Subjective:    Patient ID: Dennis Fox, male    DOB: 1965-11-24, 56 y.o.   MRN: [id number]  HPI He states that he started mowing his lawn last Thursday and had difficulty with nasal congestion, sneezing, itchy watery eyes, rhinorrhea.  The drainage has become slightly purulent and he is concerned about an infection.  He has been using Zyrtec.  He usually has problems with this in the spring and the fall.   Review of Systems     Objective:   Physical Exam Alert and in no distress. Tympanic membranes and canals are normal. Pharyngeal area is normal. Neck is supple without adenopathy or thyromegaly. Cardiac exam shows a regular sinus rhythm without murmurs or gallops. Lungs are clear to auscultation.        Assessment & Plan:  Seasonal allergic rhinitis due to pollen - Plan: fluticasone (FLONASE) 50 MCG/ACT nasal spray He is to add Flonase to his regimen and continue on Zyrtec.  I did call him azithromycin but recommend he wait several days to see if more aggressive treatment of his allergies with his nasal steroid and Zyrtec controls his symptoms.

## 2022-06-11 DIAGNOSIS — G4733 Obstructive sleep apnea (adult) (pediatric): Secondary | ICD-10-CM | POA: Diagnosis not present

## 2022-06-18 DIAGNOSIS — X32XXXD Exposure to sunlight, subsequent encounter: Secondary | ICD-10-CM | POA: Diagnosis not present

## 2022-06-18 DIAGNOSIS — L570 Actinic keratosis: Secondary | ICD-10-CM | POA: Diagnosis not present

## 2022-06-18 DIAGNOSIS — Z85828 Personal history of other malignant neoplasm of skin: Secondary | ICD-10-CM | POA: Diagnosis not present

## 2022-06-18 DIAGNOSIS — Z08 Encounter for follow-up examination after completed treatment for malignant neoplasm: Secondary | ICD-10-CM | POA: Diagnosis not present

## 2022-06-25 ENCOUNTER — Other Ambulatory Visit: Payer: Self-pay | Admitting: Medical

## 2022-07-12 DIAGNOSIS — G4733 Obstructive sleep apnea (adult) (pediatric): Secondary | ICD-10-CM | POA: Diagnosis not present

## 2022-09-11 DIAGNOSIS — G4733 Obstructive sleep apnea (adult) (pediatric): Secondary | ICD-10-CM | POA: Diagnosis not present

## 2022-09-26 ENCOUNTER — Other Ambulatory Visit: Payer: Self-pay | Admitting: Medical

## 2022-09-26 MED ORDER — ALBUTEROL SULFATE HFA 108 (90 BASE) MCG/ACT IN AERS: INHALATION_SPRAY | RESPIRATORY_TRACT | 0 refills | Status: DC

## 2022-09-26 NOTE — Telephone Encounter (Signed)
Sent med to pharmacy  

## 2022-10-12 DIAGNOSIS — G4733 Obstructive sleep apnea (adult) (pediatric): Secondary | ICD-10-CM | POA: Diagnosis not present

## 2022-11-10 DIAGNOSIS — G4733 Obstructive sleep apnea (adult) (pediatric): Secondary | ICD-10-CM | POA: Diagnosis not present

## 2022-12-05 ENCOUNTER — Encounter: Payer: Self-pay | Admitting: Medical

## 2022-12-05 ENCOUNTER — Ambulatory Visit (INDEPENDENT_AMBULATORY_CARE_PROVIDER_SITE_OTHER): Payer: BC Managed Care – PPO | Admitting: Medical

## 2022-12-05 VITALS — BP 134/80 | HR 54 | Ht 67.0 in | Wt 220.4 lb

## 2022-12-05 DIAGNOSIS — E785 Hyperlipidemia, unspecified: Secondary | ICD-10-CM | POA: Diagnosis not present

## 2022-12-05 DIAGNOSIS — K219 Gastro-esophageal reflux disease without esophagitis: Secondary | ICD-10-CM | POA: Diagnosis not present

## 2022-12-05 DIAGNOSIS — Z8042 Family history of malignant neoplasm of prostate: Secondary | ICD-10-CM

## 2022-12-05 DIAGNOSIS — Z125 Encounter for screening for malignant neoplasm of prostate: Secondary | ICD-10-CM

## 2022-12-05 DIAGNOSIS — Z0000 Encounter for general adult medical examination without abnormal findings: Secondary | ICD-10-CM

## 2022-12-05 DIAGNOSIS — Z136 Encounter for screening for cardiovascular disorders: Secondary | ICD-10-CM

## 2022-12-05 DIAGNOSIS — Z122 Encounter for screening for malignant neoplasm of respiratory organs: Secondary | ICD-10-CM

## 2022-12-05 DIAGNOSIS — Z7185 Encounter for immunization safety counseling: Secondary | ICD-10-CM

## 2022-12-05 DIAGNOSIS — R5383 Other fatigue: Secondary | ICD-10-CM

## 2022-12-05 DIAGNOSIS — J302 Other seasonal allergic rhinitis: Secondary | ICD-10-CM | POA: Diagnosis not present

## 2022-12-05 DIAGNOSIS — Z23 Encounter for immunization: Secondary | ICD-10-CM

## 2022-12-05 LAB — LIPID PANEL

## 2022-12-05 NOTE — Progress Notes (Signed)
Subjective:   HPI  Dennis Fox is a 57 y.o. male who presents for Chief Complaint  Patient presents with   Annual Exam    Fasting CPE, pt would like to test his testosterone levels.     Patient Care Team: Junice Fei, Cleda Mccreedy as PCP - General (Family Medicine) Sees dentist Sees eye doctor Dr. Nita Sells, dermatology  Concerns: Fatigue, wants TST checked  tobacco use - smokes 1ppd, x 26+ years.   Reviewed their medical, surgical, family, social, medication, and allergy history and updated chart as appropriate.  Past Medical History:  Diagnosis Date   Allergy    Dyslipidemia    GERD (gastroesophageal reflux disease)    Tobacco use disorder     Past Surgical History:  Procedure Laterality Date   EYE SURGERY     pterygium   KNEE SURGERY     age 12yo    Family History  Problem Relation Age of Onset   Heart disease Mother 3       CABG   Cancer Mother        non hodkins lymphoma   Heart disease Father 66       CABG   COPD Father    Pneumonia Father        died of PNA   Diabetes Father    Cancer Maternal Uncle        uncle   Cancer Paternal Uncle    Stroke Neg Hx      Current Outpatient Medications:    albuterol (VENTOLIN HFA) 108 (90 Base) MCG/ACT inhaler, TAKE 2 PUFFS BY MOUTH EVERY 6 HOURS AS NEEDED FOR WHEEZE OR SHORTNESS OF BREATH, Disp: 8.5 each, Rfl: 0   cetirizine (ZYRTEC) 10 MG tablet, TAKE 1 TABLET BY MOUTH EVERYDAY AT BEDTIME, Disp: 90 tablet, Rfl: 3   fluticasone (FLONASE) 50 MCG/ACT nasal spray, Place 2 sprays into both nostrils daily., Disp: 16 g, Rfl: 6   rosuvastatin (CRESTOR) 10 MG tablet, TAKE 1 TABLET BY MOUTH EVERY DAY, Disp: 30 tablet, Rfl: 2   azithromycin (ZITHROMAX) 500 MG tablet, Take 1 tablet (500 mg total) by mouth daily., Disp: 3 tablet, Rfl: 0   Multiple Vitamins-Minerals (EMERGEN-C IMMUNE PLUS) PACK, Take 1 tablet by mouth 2 (two) times daily., Disp: 10 each, Rfl: 0   promethazine-dextromethorphan (PROMETHAZINE-DM) 6.25-15  MG/5ML syrup, Take 5 mLs by mouth 4 (four) times daily as needed for cough., Disp: 120 mL, Rfl: 0  No Known Allergies  Review of Systems  Constitutional:  Negative for chills, fever, malaise/fatigue and weight loss.  HENT:  Negative for congestion, ear pain, hearing loss, sore throat and tinnitus.   Eyes:  Negative for blurred vision, pain and redness.  Respiratory:  Negative for cough, hemoptysis and shortness of breath.   Cardiovascular:  Negative for chest pain, palpitations, orthopnea, claudication and leg swelling.  Gastrointestinal:  Negative for abdominal pain, blood in stool, constipation, diarrhea, nausea and vomiting.  Genitourinary:  Negative for dysuria, flank pain, frequency, hematuria and urgency.  Musculoskeletal:  Negative for falls, joint pain and myalgias.  Skin:  Negative for itching and rash.  Neurological:  Negative for dizziness, tingling, speech change, weakness and headaches.  Endo/Heme/Allergies:  Negative for polydipsia. Does not bruise/bleed easily.  Psychiatric/Behavioral:  Negative for depression and memory loss. The patient is not nervous/anxious and does not have insomnia.        Objective:  BP 134/80   Pulse (!) 54   Ht  (1.702 m)  Wt 220 lb 6.4 oz (100 kg)   SpO2 93%   BMI 34.52 kg/m    Wt Readings from Last 3 Encounters:  12/05/22 220 lb 6.4 oz (100 kg)  06/04/22 219 lb 3.2 oz (99.4 kg)  02/19/22 213 lb (96.6 kg)   BP Readings from Last 3 Encounters:  12/05/22 134/80  06/04/22 100/62  02/19/22 121/83    General appearance: alert, no distress, WD/WN, Caucasian male Skin: scattered macules HEENT: normocephalic, conjunctiva/corneas normal, sclerae anicteric, PERRLA, EOMi, nares with turbinated edema, pinkish coloration, no discharge or erythema pharynx normal Oral cavity: MMM, tongue normal, teeth in good repair Neck: supple, no lymphadenopathy, no thyromegaly, no masses, normal ROM, no bruits Chest: non tender, normal shape and  expansion Heart: RRR, normal S1, S2, no murmurs Lungs: CTA bilaterally, no wheezes, rhonchi, or rales Abdomen: +bs, soft, non tender, non distended, no masses, no hepatomegaly, no splenomegaly, no bruits Back: non tender, normal ROM, no scoliosis Musculoskeletal: upper extremities non tender, no obvious deformity, normal ROM throughout, lower extremities non tender, no obvious deformity, normal ROM throughout Extremities: mild varicose veins LE, no edema, no cyanosis, no clubbing Pulses: 2+ symmetric, upper and lower extremities, normal cap refill Neurological: alert, oriented x 3, CN2-12 intact, strength normal upper extremities and lower extremities, sensation normal throughout, DTRs 2+ throughout, no cerebellar signs, gait normal Psychiatric: normal affect, behavior normal, pleasant  GU: normal male external genitalia,circumcised, nontender, no masses, no hernia, no lymphadenopathy Rectal: anus normal tone, prostate wnl    Assessment and Plan :   Encounter Diagnoses  Name Primary?   Annual physical exam Yes   Hyperlipidemia LDL goal <70    Seasonal allergic rhinitis, unspecified trigger    Gastroesophageal reflux disease, unspecified whether esophagitis present    Family history of prostate cancer    Vaccine counseling    Screening for prostate cancer    Screening for heart disease    Screening for lung cancer    Need for Tdap vaccination    Other fatigue     This visit was a preventative care visit, also known as wellness visit or routine physical.   Topics typically include healthy lifestyle, diet, exercise, preventative care, vaccinations, sick and well care, proper use of emergency dept and after hours care, as well as other concerns.     Recommendations: Continue to return yearly for your annual wellness and preventative care visits.  This gives [place] a chance to discuss healthy lifestyle, exercise, vaccinations, review your chart record, and perform screenings where  appropriate.  I recommend you see your eye doctor yearly for routine vision care.  I recommend you see your dentist yearly for routine dental care including hygiene visits twice yearly.   Vaccination recommendations were reviewed Immunization History  Administered Date(s) Administered   Tdap 03/01/2010, 12/05/2022   Counseled on the Tdap (tetanus, diptheria, and acellular pertussis) vaccine.  Vaccine information sheet given. Tdap vaccine given after consent obtained.  counseled on shingrix, but he declines   Screening for cancer: Colon cancer screening: Reviewed Cologard from 2022, normal  We discussed PSA, prostate exam, and prostate cancer screening risks/benefits.     Skin cancer screening: Check your skin regularly for new changes, growing lesions, or other lesions of concern Come in for evaluation if you have skin lesions of concern.  Lung cancer screening: If you have a greater than 20 pack year history of tobacco use, then you may qualify for lung cancer screening with a chest CT scan.   Please  call your insurance company to inquire about coverage for this test.  We currently don't have screenings for other cancers besides breast, cervical, colon, and lung cancers.  If you have a strong family history of cancer or have other cancer screening concerns, please let me know.    Bone health: Get at least 150 minutes of aerobic exercise weekly Get weight bearing exercise at least once weekly Bone density test:  A bone density test is an imaging test that uses a type of X-ray to measure the amount of calcium and other minerals in your bones. The test may be used to diagnose or screen you for a condition that causes weak or thin bones (osteoporosis), predict your risk for a broken bone (fracture), or determine how well your osteoporosis treatment is working. The bone density test is recommended for females 65 and older, or females or males <65 if certain risk factors such as  thyroid disease, long term use of steroids such as for asthma or rheumatological issues, vitamin D deficiency, estrogen deficiency, family history of osteoporosis, self or family history of fragility fracture in first degree relative.    Heart health: Get at least 150 minutes of aerobic exercise weekly Limit alcohol It is important to maintain a healthy blood pressure and healthy cholesterol numbers  Heart disease screening: Screening for heart disease includes screening for blood pressure, fasting lipids, glucose/diabetes screening, BMI height to weight ratio, reviewed of smoking status, physical activity, and diet.    Goals include blood pressure 120/80 or less, maintaining a healthy lipid/cholesterol profile, preventing diabetes or keeping diabetes numbers under good control, not smoking or using tobacco products, exercising most days per week or at least 150 minutes per week of exercise, and eating healthy variety of fruits and vegetables, healthy oils, and avoiding unhealthy food choices like fried food, fast food, high sugar and high cholesterol foods.    Referral for CT coronary   Medical care options: I recommend you continue to seek care here first for routine care.  We try really hard to have available appointments Monday through Friday daytime hours for sick visits, acute visits, and physicals.  Urgent care should be used for after hours and weekends for significant issues that cannot wait till the next day.  The emergency department should be used for significant potentially life-threatening emergencies.  The emergency department is expensive, can often have long wait times for less significant concerns, so try to utilize primary care, urgent care, or telemedicine when possible to avoid unnecessary trips to the emergency department.  Virtual visits and telemedicine have been introduced since the pandemic started in 2020, and can be convenient ways to receive medical care.  We offer  virtual appointments as well to assist you in a variety of options to seek medical care.    Separate significant issues discussed: Smoker -I strongly recommend you quit smoking.    Obesity-I recommend you continue efforts to lose weight through healthy diet and exercise  Hyperlipidemia-continue statin, labs today  Varicose veins-mild.  Consider wearing compression socks daily  Allergic rhinitis-continue medicines as usual  Dennis Fox was seen today for annual exam.  Diagnoses and all orders for this visit:  Annual physical exam -     Comprehensive metabolic panel -     CBC with Differential/Platelet -     Lipid panel -     PSA -     Cancel: POCT Urinalysis DIP (Proadvantage Device) -     Cancel: EKG 12-Lead -  CT CARDIAC SCORING (SELF PAY ONLY); Future -     Testosterone -     TSH  Hyperlipidemia LDL goal <70 -     Lipid panel  Seasonal allergic rhinitis, unspecified trigger  Gastroesophageal reflux disease, unspecified whether esophagitis present  Family history of prostate cancer  Vaccine counseling  Screening for prostate cancer -     PSA  Screening for heart disease -     CT CARDIAC SCORING (SELF PAY ONLY); Future  Screening for lung cancer  Need for Tdap vaccination -     Tdap vaccine greater than or equal to 7yo IM  Other fatigue -     Testosterone -     TSH   Follow-up pending labs, yearly for physical

## 2022-12-06 LAB — CBC WITH DIFFERENTIAL/PLATELET
Basophils Absolute: 0.1 10*3/uL (ref 0.0–0.2)
Basos: 1 %
EOS (ABSOLUTE): 0.2 10*3/uL (ref 0.0–0.4)
Eos: 3 %
Hematocrit: 43.5 % (ref 37.5–51.0)
Hemoglobin: 15.1 g/dL (ref 13.0–17.7)
Immature Grans (Abs): 0.1 10*3/uL (ref 0.0–0.1)
Immature Granulocytes: 1 %
Lymphocytes Absolute: 1.9 10*3/uL (ref 0.7–3.1)
Lymphs: 26 %
MCH: 32.8 pg (ref 26.6–33.0)
MCHC: 34.7 g/dL (ref 31.5–35.7)
MCV: 95 fL (ref 79–97)
Monocytes Absolute: 0.5 10*3/uL (ref 0.1–0.9)
Monocytes: 7 %
Neutrophils Absolute: 4.6 10*3/uL (ref 1.4–7.0)
Neutrophils: 62 %
Platelets: 206 10*3/uL (ref 150–450)
RBC: 4.6 x10E6/uL (ref 4.14–5.80)
RDW: 12.1 % (ref 11.6–15.4)
WBC: 7.3 10*3/uL (ref 3.4–10.8)

## 2022-12-06 LAB — COMPREHENSIVE METABOLIC PANEL
ALT: 22 IU/L (ref 0–44)
AST: 20 IU/L (ref 0–40)
Albumin/Globulin Ratio: 1.9 (ref 1.2–2.2)
Albumin: 4.5 g/dL (ref 3.8–4.9)
Alkaline Phosphatase: 70 IU/L (ref 44–121)
BUN/Creatinine Ratio: 20 (ref 9–20)
BUN: 19 mg/dL (ref 6–24)
Bilirubin Total: 0.8 mg/dL (ref 0.0–1.2)
CO2: 18 mmol/L — ABNORMAL LOW (ref 20–29)
Calcium: 9.4 mg/dL (ref 8.7–10.2)
Chloride: 104 mmol/L (ref 96–106)
Creatinine, Ser: 0.97 mg/dL (ref 0.76–1.27)
Globulin, Total: 2.4 g/dL (ref 1.5–4.5)
Glucose: 95 mg/dL (ref 70–99)
Potassium: 4.5 mmol/L (ref 3.5–5.2)
Sodium: 139 mmol/L (ref 134–144)
Total Protein: 6.9 g/dL (ref 6.0–8.5)
eGFR: 91 mL/min/{1.73_m2} (ref >59)

## 2022-12-06 LAB — TESTOSTERONE: Testosterone: 272 ng/dL (ref 264–916)

## 2022-12-06 LAB — LIPID PANEL
Chol/HDL Ratio: 3.1 ratio (ref 0.0–5.0)
Cholesterol, Total: 172 mg/dL (ref 100–199)
HDL: 55 mg/dL (ref >39)
LDL Chol Calc (NIH): 91 mg/dL (ref 0–99)
Triglycerides: 147 mg/dL (ref 0–149)
VLDL Cholesterol Cal: 26 mg/dL (ref 5–40)

## 2022-12-06 LAB — TSH: TSH: 3.45 u[IU]/mL (ref 0.450–4.500)

## 2022-12-06 LAB — PSA: Prostate Specific Ag, Serum: 0.3 ng/mL (ref 0.0–4.0)

## 2022-12-07 ENCOUNTER — Other Ambulatory Visit: Payer: Self-pay | Admitting: Medical

## 2022-12-07 DIAGNOSIS — J301 Allergic rhinitis due to pollen: Secondary | ICD-10-CM

## 2022-12-07 MED ORDER — ALBUTEROL SULFATE HFA 108 (90 BASE) MCG/ACT IN AERS: INHALATION_SPRAY | RESPIRATORY_TRACT | 0 refills | Status: DC

## 2022-12-07 MED ORDER — ROSUVASTATIN CALCIUM 10 MG PO TABS: 10.0000 mg | ORAL_TABLET | Freq: Every day | ORAL | 3 refills | Status: DC

## 2022-12-07 MED ORDER — CETIRIZINE HCL 10 MG PO TABS: ORAL_TABLET | ORAL | 3 refills | Status: DC

## 2022-12-07 MED ORDER — FLUTICASONE PROPIONATE 50 MCG/ACT NA SUSP
2.0000 | Freq: Every day | NASAL | 6 refills | Status: DC
Start: 2022-12-07 — End: 2024-03-25

## 2022-12-07 NOTE — Progress Notes (Signed)
Labs show blood sugar, liver, kidney, and electrolytes normal.  Blood counts normal.  Cholesterol normal.  PSA prostate lab normal.  Testosterone technically normal but on the low end of normal, thyroid normal.  Expect a phone call about the CT heart test  I recommend you work on efforts quit smoking.  If would like to try some medication to help quit smoking, then let me know.  Sometimes people will recheck the testosterone for second reading given as it was close to the borderline.  You are welcome to do that if you want  Continue current medications

## 2022-12-26 ENCOUNTER — Other Ambulatory Visit: Payer: Self-pay | Admitting: Medical

## 2022-12-29 ENCOUNTER — Ambulatory Visit: Admission: RE | Admit: 2022-12-29 | Payer: BC Managed Care – PPO | Source: Ambulatory Visit

## 2023-02-04 ENCOUNTER — Ambulatory Visit
Admission: RE | Admit: 2023-02-04 | Discharge: 2023-02-04 | Disposition: A | Discharge status: Home / Self Care | Payer: Self-pay | Source: Ambulatory Visit | Attending: Medical | Admitting: Medical

## 2023-02-04 DIAGNOSIS — Z0000 Encounter for general adult medical examination without abnormal findings: Secondary | ICD-10-CM

## 2023-02-04 DIAGNOSIS — Z136 Encounter for screening for cardiovascular disorders: Secondary | ICD-10-CM

## 2023-02-06 ENCOUNTER — Other Ambulatory Visit: Payer: Self-pay | Admitting: Medical

## 2023-02-06 MED ORDER — ROSUVASTATIN CALCIUM 20 MG PO TABS: 20.0000 mg | ORAL_TABLET | Freq: Every day | ORAL | 2 refills | Status: DC

## 2023-02-06 NOTE — Progress Notes (Signed)
Results sent through MyChart

## 2023-03-09 DIAGNOSIS — M545 Low back pain, unspecified: Secondary | ICD-10-CM | POA: Diagnosis not present

## 2023-11-18 DIAGNOSIS — M545 Low back pain, unspecified: Secondary | ICD-10-CM | POA: Diagnosis not present

## 2023-11-18 DIAGNOSIS — M5416 Radiculopathy, lumbar region: Secondary | ICD-10-CM | POA: Diagnosis not present

## 2024-01-27 DIAGNOSIS — M5416 Radiculopathy, lumbar region: Secondary | ICD-10-CM | POA: Diagnosis not present

## 2024-01-27 DIAGNOSIS — M545 Low back pain, unspecified: Secondary | ICD-10-CM | POA: Diagnosis not present

## 2024-03-06 ENCOUNTER — Other Ambulatory Visit: Payer: Self-pay | Admitting: Medical

## 2024-03-07 NOTE — Telephone Encounter (Signed)
 Scheduled cpe 03/25/24

## 2024-03-07 NOTE — Telephone Encounter (Signed)
 Patient is overdue for his CPE. Please schedule. Once scheduled, I can refill medication

## 2024-03-25 ENCOUNTER — Encounter: Payer: Self-pay | Admitting: Medical

## 2024-03-25 ENCOUNTER — Ambulatory Visit (INDEPENDENT_AMBULATORY_CARE_PROVIDER_SITE_OTHER): Admitting: Medical

## 2024-03-25 VITALS — BP 120/72 | HR 54 | Ht 67.0 in | Wt 223.8 lb

## 2024-03-25 DIAGNOSIS — Z0000 Encounter for general adult medical examination without abnormal findings: Secondary | ICD-10-CM

## 2024-03-25 DIAGNOSIS — I2510 Atherosclerotic heart disease of native coronary artery without angina pectoris: Secondary | ICD-10-CM | POA: Insufficient documentation

## 2024-03-25 DIAGNOSIS — Z7141 Alcohol abuse counseling and surveillance of alcoholic: Secondary | ICD-10-CM | POA: Insufficient documentation

## 2024-03-25 DIAGNOSIS — E785 Hyperlipidemia, unspecified: Secondary | ICD-10-CM

## 2024-03-25 DIAGNOSIS — Z125 Encounter for screening for malignant neoplasm of prostate: Secondary | ICD-10-CM | POA: Diagnosis not present

## 2024-03-25 DIAGNOSIS — Z1211 Encounter for screening for malignant neoplasm of colon: Secondary | ICD-10-CM | POA: Diagnosis not present

## 2024-03-25 DIAGNOSIS — F17200 Nicotine dependence, unspecified, uncomplicated: Secondary | ICD-10-CM | POA: Diagnosis not present

## 2024-03-25 DIAGNOSIS — Z6835 Body mass index (BMI) 35.0-35.9, adult: Secondary | ICD-10-CM | POA: Insufficient documentation

## 2024-03-25 DIAGNOSIS — J301 Allergic rhinitis due to pollen: Secondary | ICD-10-CM | POA: Diagnosis not present

## 2024-03-25 DIAGNOSIS — Z122 Encounter for screening for malignant neoplasm of respiratory organs: Secondary | ICD-10-CM

## 2024-03-25 DIAGNOSIS — K219 Gastro-esophageal reflux disease without esophagitis: Secondary | ICD-10-CM

## 2024-03-25 DIAGNOSIS — Z1389 Encounter for screening for other disorder: Secondary | ICD-10-CM

## 2024-03-25 LAB — LIPID PANEL

## 2024-03-25 MED ORDER — ASPIRIN 81 MG PO TBEC: 81.0000 mg | DELAYED_RELEASE_TABLET | Freq: Every day | ORAL | 3 refills | Status: AC

## 2024-03-25 MED ORDER — FLUTICASONE PROPIONATE 50 MCG/ACT NA SUSP
2.0000 | Freq: Every day | NASAL | 6 refills | Status: AC
Start: 2024-03-25 — End: ?

## 2024-03-25 MED ORDER — LEVOCETIRIZINE DIHYDROCHLORIDE 5 MG PO TABS: 5.0000 mg | ORAL_TABLET | Freq: Every evening | ORAL | 2 refills | Status: AC

## 2024-03-25 MED ORDER — ALBUTEROL SULFATE HFA 108 (90 BASE) MCG/ACT IN AERS: INHALATION_SPRAY | RESPIRATORY_TRACT | 0 refills | Status: DC

## 2024-03-25 NOTE — Progress Notes (Signed)
 Subjective:   HPI  Dennis Fox is a 58 y.o. male who presents for Chief Complaint  Patient presents with   Annual Exam    Fasting cpe, no concerns, declines vaccines today, cologuard due    Patient Care Team: Sherrian Nunnelley, Alm GORMAN RIGGERS as PCP - General (Family Medicine) Danella Donnice RIGGERS (Orthopedic Surgery) Sees dentist Sees eye doctor Dr. Norleen Hurst, dermatology  Concerns: He continues on allergy medicine daily.  He continues on Crestor  without complaint  No recent issues.  Sees dermatology regularly.  Reviewed their medical, surgical, family, social, medication, and allergy history and updated chart as appropriate.  Past Medical History:  Diagnosis Date   Allergy    Dyslipidemia    GERD (gastroesophageal reflux disease)    Tobacco use disorder     Past Surgical History:  Procedure Laterality Date   EYE SURGERY     pterygium   KNEE SURGERY     age 74yo    Family History  Problem Relation Age of Onset   Heart disease Mother 28       CABG   Cancer Mother        non hodkins lymphoma   Heart disease Father 42       CABG   COPD Father    Pneumonia Father        died of PNA   Diabetes Father    Cancer Maternal Uncle        uncle   Cancer Paternal Uncle    Stroke Neg Hx      Current Outpatient Medications:    aspirin  EC 81 MG tablet, Take 1 tablet (81 mg total) by mouth daily., Disp: 90 tablet, Rfl: 3   cetirizine  (ZYRTEC ) 10 MG tablet, TAKE 1 TABLET BY MOUTH EVERYDAY AT BEDTIME, Disp: 90 tablet, Rfl: 3   levocetirizine (XYZAL) 5 MG tablet, Take 1 tablet (5 mg total) by mouth every evening., Disp: 90 tablet, Rfl: 2   rosuvastatin  (CRESTOR ) 20 MG tablet, TAKE 1 TABLET BY MOUTH EVERY DAY, Disp: 30 tablet, Rfl: 0   albuterol  (VENTOLIN  HFA) 108 (90 Base) MCG/ACT inhaler, TAKE 2 PUFFS BY MOUTH EVERY 6 HOURS AS NEEDED FOR WHEEZE OR SHORTNESS OF BREATH, Disp: 8.5 each, Rfl: 0   fluticasone  (FLONASE ) 50 MCG/ACT nasal spray, Place 2 sprays into both nostrils  daily., Disp: 16 g, Rfl: 6  No Known Allergies  Review of Systems  Constitutional:  Negative for chills, fever, malaise/fatigue and weight loss.  HENT:  Negative for congestion, ear pain, hearing loss, sore throat and tinnitus.   Eyes:  Negative for blurred vision, pain and redness.  Respiratory:  Negative for cough, hemoptysis and shortness of breath.   Cardiovascular:  Negative for chest pain, palpitations, orthopnea, claudication and leg swelling.  Gastrointestinal:  Negative for abdominal pain, blood in stool, constipation, diarrhea, nausea and vomiting.  Genitourinary:  Negative for dysuria, flank pain, frequency, hematuria and urgency.  Musculoskeletal:  Negative for falls, joint pain and myalgias.  Skin:  Negative for itching and rash.  Neurological:  Negative for dizziness, tingling, speech change, weakness and headaches.  Endo/Heme/Allergies:  Negative for polydipsia. Does not bruise/bleed easily.  Psychiatric/Behavioral:  Negative for depression and memory loss. The patient is not nervous/anxious and does not have insomnia.        Objective:  BP 120/72   Pulse (!) 54 Comment: normal for pt  Ht 5' 7 (1.702 m)   Wt 223 lb 12.8 oz (101.5 kg)   SpO2 97%  BMI 35.05 kg/m    Wt Readings from Last 3 Encounters:  03/25/24 223 lb 12.8 oz (101.5 kg)  12/05/22 220 lb 6.4 oz (100 kg)  06/04/22 219 lb 3.2 oz (99.4 kg)   BP Readings from Last 3 Encounters:  03/25/24 120/72  12/05/22 134/80  06/04/22 100/62    General appearance: alert, no distress, WD/WN, Caucasian male Skin: scattered macules HEENT: normocephalic, conjunctiva/corneas normal, sclerae anicteric, PERRLA, EOMi, nares with turbinated edema, pinkish coloration, no discharge or erythema pharynx normal Oral cavity: MMM, tongue normal, teeth in good repair Neck: supple, no lymphadenopathy, no thyromegaly, no masses, normal ROM, no bruits Chest: non tender, normal shape and expansion Heart: RRR, normal S1, S2, no  murmurs Lungs: CTA bilaterally, no wheezes, rhonchi, or rales Abdomen: +bs, soft, non tender, non distended, no masses, no hepatomegaly, no splenomegaly, no bruits Back: non tender, normal ROM, no scoliosis Musculoskeletal: upper extremities non tender, no obvious deformity, normal ROM throughout, lower extremities non tender, no obvious deformity, normal ROM throughout Extremities: mild varicose veins LE, no edema, no cyanosis, no clubbing Pulses: 2+ symmetric, upper and lower extremities, normal cap refill Neurological: alert, oriented x 3, CN2-12 intact, strength normal upper extremities and lower extremities, sensation normal throughout, DTRs 2+ throughout, no cerebellar signs, gait normal Psychiatric: normal affect, behavior normal, pleasant  GU: normal male external genitalia,circumcised, nontender, no masses, no hernia, no lymphadenopathy Rectal:deferred    Assessment and Plan :   Encounter Diagnoses  Name Primary?   Annual physical exam Yes   Screening for colon cancer    Seasonal allergic rhinitis due to pollen    Smoker    Hyperlipidemia LDL goal <70    Gastroesophageal reflux disease, unspecified whether esophagitis present    Screening for prostate cancer    Screening for lung cancer    Screening for hematuria or proteinuria    BMI 35.0-35.9,adult    Alcohol cessation counseling    Coronary artery disease involving native coronary artery of native heart without angina pectoris     This visit was a preventative care visit, also known as wellness visit or routine physical.   Topics typically include healthy lifestyle, diet, exercise, preventative care, vaccinations, sick and well care, proper use of emergency dept and after hours care, as well as other concerns.     Recommendations: Continue to return yearly for your annual wellness and preventative care visits.  This gives us  a chance to discuss healthy lifestyle, exercise, vaccinations, review your chart record, and  perform screenings where appropriate.  I recommend you see your eye doctor yearly for routine vision care.  I recommend you see your dentist yearly for routine dental care including hygiene visits twice yearly.   Vaccination recommendations were reviewed Immunization History  Administered Date(s) Administered   Tdap 03/01/2010, 12/05/2022   Consider shingles vaccine, pneumococcal vaccine, yearly flu shot.  He declines for now.   Screening for cancer: Colon cancer screening: Reviewed Cologard from 2022, normal.  New referral sent for updated Cologuard  We discussed PSA, prostate exam, and prostate cancer screening risks/benefits.     Skin cancer screening: Check your skin regularly for new changes, growing lesions, or other lesions of concern Come in for evaluation if you have skin lesions of concern.  Lung cancer screening: If you have a greater than 20 pack year history of tobacco use, then you may qualify for lung cancer screening with a chest CT scan.   Please call your insurance company to inquire about coverage for  this test.  We currently don't have screenings for other cancers besides breast, cervical, colon, and lung cancers.  If you have a strong family history of cancer or have other cancer screening concerns, please let me know.    Bone health: Get at least 150 minutes of aerobic exercise weekly Get weight bearing exercise at least once weekly Bone density test:  A bone density test is an imaging test that uses a type of X-ray to measure the amount of calcium  and other minerals in your bones. The test may be used to diagnose or screen you for a condition that causes weak or thin bones (osteoporosis), predict your risk for a broken bone (fracture), or determine how well your osteoporosis treatment is working. The bone density test is recommended for females 65 and older, or females or males <65 if certain risk factors such as thyroid disease, long term use of steroids  such as for asthma or rheumatological issues, vitamin D deficiency, estrogen deficiency, family history of osteoporosis, self or family history of fragility fracture in first degree relative.    Heart health: Get at least 150 minutes of aerobic exercise weekly Limit alcohol It is important to maintain a healthy blood pressure and healthy cholesterol numbers  Heart disease screening: Screening for heart disease includes screening for blood pressure, fasting lipids, glucose/diabetes screening, BMI height to weight ratio, reviewed of smoking status, physical activity, and diet.    Goals include blood pressure 120/80 or less, maintaining a healthy lipid/cholesterol profile, preventing diabetes or keeping diabetes numbers under good control, not smoking or using tobacco products, exercising most days per week or at least 150 minutes per week of exercise, and eating healthy variety of fruits and vegetables, healthy oils, and avoiding unhealthy food choices like fried food, fast food, high sugar and high cholesterol foods.    CT coronary calcium  test 02/07/2023 showing coronary artery disease.  Continue statin.   Medical care options: I recommend you continue to seek care here first for routine care.  We try really hard to have available appointments Monday through Friday daytime hours for sick visits, acute visits, and physicals.  Urgent care should be used for after hours and weekends for significant issues that cannot wait till the next day.  The emergency department should be used for significant potentially life-threatening emergencies.  The emergency department is expensive, can often have long wait times for less significant concerns, so try to utilize primary care, urgent care, or telemedicine when possible to avoid unnecessary trips to the emergency department.  Virtual visits and telemedicine have been introduced since the pandemic started in 2020, and can be convenient ways to receive medical  care.  We offer virtual appointments as well to assist you in a variety of options to seek medical care.    Separate significant issues discussed: Smoker -I strongly recommend you quit smoking.    Obesity, BMI > 30 -I recommend you continue efforts to lose weight through healthy diet and exercise  Hyperlipidemia-continue statin, Crestor  20 mg daily, start back on aspirin  81 mg daily.  Labs today  Varicose veins-mild.  Consider wearing compression socks daily  Allergic rhinitis-changed to Xyzal for better insurance coverage.  Continue Flonase   Advised to cut down on alcohol as he drinks fairly heavily.  Coronary artery disease found on CT coronary test last year 2024-continue statin and begin back on aspirin .  Declines cardiology consult.   Dennis Fox was seen today for annual exam.  Diagnoses and all orders for this  visit:  Annual physical exam -     CT CHEST LUNG CA SCREEN LOW DOSE W/O CM; Future -     Comprehensive metabolic panel with GFR -     CBC -     Lipid panel -     PSA -     Urinalysis, Routine w reflex microscopic  Screening for colon cancer -     Cologuard  Seasonal allergic rhinitis due to pollen -     fluticasone  (FLONASE ) 50 MCG/ACT nasal spray; Place 2 sprays into both nostrils daily.  Smoker  Hyperlipidemia LDL goal <70 -     Lipid panel  Gastroesophageal reflux disease, unspecified whether esophagitis present  Screening for prostate cancer -     PSA  Screening for lung cancer -     CT CHEST LUNG CA SCREEN LOW DOSE W/O CM; Future  Screening for hematuria or proteinuria -     Urinalysis, Routine w reflex microscopic  BMI 35.0-35.9,adult  Alcohol cessation counseling  Coronary artery disease involving native coronary artery of native heart without angina pectoris  Other orders -     albuterol  (VENTOLIN  HFA) 108 (90 Base) MCG/ACT inhaler; TAKE 2 PUFFS BY MOUTH EVERY 6 HOURS AS NEEDED FOR WHEEZE OR SHORTNESS OF BREATH -     levocetirizine (XYZAL) 5  MG tablet; Take 1 tablet (5 mg total) by mouth every evening. -     aspirin  EC 81 MG tablet; Take 1 tablet (81 mg total) by mouth daily.   Follow-up pending labs, yearly for physical

## 2024-03-26 LAB — COMPREHENSIVE METABOLIC PANEL WITH GFR
ALT: 26 IU/L (ref 0–44)
AST: 19 IU/L (ref 0–40)
Albumin: 4.2 g/dL (ref 3.8–4.9)
Alkaline Phosphatase: 68 IU/L (ref 44–121)
BUN/Creatinine Ratio: 16 (ref 9–20)
BUN: 15 mg/dL (ref 6–24)
Bilirubin Total: 0.6 mg/dL (ref 0.0–1.2)
CO2: 19 mmol/L — ABNORMAL LOW (ref 20–29)
Calcium: 9.5 mg/dL (ref 8.7–10.2)
Chloride: 103 mmol/L (ref 96–106)
Creatinine, Ser: 0.91 mg/dL (ref 0.76–1.27)
Globulin, Total: 2.4 g/dL (ref 1.5–4.5)
Glucose: 94 mg/dL (ref 70–99)
Potassium: 4.4 mmol/L (ref 3.5–5.2)
Sodium: 138 mmol/L (ref 134–144)
Total Protein: 6.6 g/dL (ref 6.0–8.5)
eGFR: 98 mL/min/1.73 (ref >59)

## 2024-03-26 LAB — URINALYSIS, ROUTINE W REFLEX MICROSCOPIC
Bilirubin, UA: NEGATIVE
Glucose, UA: NEGATIVE
Ketones, UA: NEGATIVE
Leukocytes,UA: NEGATIVE
Nitrite, UA: NEGATIVE
Protein,UA: NEGATIVE
RBC, UA: NEGATIVE
Specific Gravity, UA: 1.016 (ref 1.005–1.030)
Urobilinogen, Ur: 0.2 mg/dL (ref 0.2–1.0)
pH, UA: 7 (ref 5.0–7.5)

## 2024-03-26 LAB — LIPID PANEL
Chol/HDL Ratio: 4.2 ratio (ref 0.0–5.0)
Cholesterol, Total: 193 mg/dL (ref 100–199)
HDL: 46 mg/dL (ref >39)
LDL Chol Calc (NIH): 112 mg/dL — ABNORMAL HIGH (ref 0–99)
Triglycerides: 204 mg/dL — ABNORMAL HIGH (ref 0–149)
VLDL Cholesterol Cal: 35 mg/dL (ref 5–40)

## 2024-03-26 LAB — CBC
Hematocrit: 42.9 % (ref 37.5–51.0)
Hemoglobin: 14.4 g/dL (ref 13.0–17.7)
MCH: 33.3 pg — ABNORMAL HIGH (ref 26.6–33.0)
MCHC: 33.6 g/dL (ref 31.5–35.7)
MCV: 99 fL — ABNORMAL HIGH (ref 79–97)
Platelets: 234 x10E3/uL (ref 150–450)
RBC: 4.32 x10E6/uL (ref 4.14–5.80)
RDW: 12.2 % (ref 11.6–15.4)
WBC: 8.5 x10E3/uL (ref 3.4–10.8)

## 2024-03-26 LAB — PSA: Prostate Specific Ag, Serum: 0.3 ng/mL (ref 0.0–4.0)

## 2024-03-28 ENCOUNTER — Ambulatory Visit: Payer: Self-pay | Admitting: Medical

## 2024-03-28 ENCOUNTER — Other Ambulatory Visit: Payer: Self-pay | Admitting: Medical

## 2024-03-28 ENCOUNTER — Encounter: Payer: Self-pay | Admitting: Medical

## 2024-03-28 MED ORDER — ROSUVASTATIN CALCIUM 20 MG PO TABS: 20.0000 mg | ORAL_TABLET | Freq: Every day | ORAL | 2 refills | Status: AC

## 2024-03-28 NOTE — Progress Notes (Signed)
Results through My Chart

## 2024-03-30 ENCOUNTER — Ambulatory Visit: Payer: Self-pay | Admitting: Medical

## 2024-03-30 NOTE — Progress Notes (Signed)
 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  Either way he should still see dermatologist yearly for surveillance

## 2024-04-01 ENCOUNTER — Ambulatory Visit
Admission: RE | Admit: 2024-04-01 | Discharge: 2024-04-01 | Disposition: A | Discharge status: Home / Self Care | Source: Ambulatory Visit | Attending: Medical | Admitting: Medical

## 2024-04-01 DIAGNOSIS — Z122 Encounter for screening for malignant neoplasm of respiratory organs: Secondary | ICD-10-CM | POA: Diagnosis not present

## 2024-04-01 DIAGNOSIS — Z0000 Encounter for general adult medical examination without abnormal findings: Secondary | ICD-10-CM

## 2024-04-01 DIAGNOSIS — F17210 Nicotine dependence, cigarettes, uncomplicated: Secondary | ICD-10-CM | POA: Diagnosis not present

## 2024-04-07 ENCOUNTER — Telehealth: Payer: Self-pay | Admitting: Internal Medicine

## 2024-04-07 NOTE — Progress Notes (Signed)
 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  This can sometimes worsen to aneurysm.  The recommendation is yearly follow-up on this.  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

## 2024-04-07 NOTE — Telephone Encounter (Signed)
 Copied from CRM #[id number]. Topic: Clinical - Lab/Test Results >> Apr 07, 2024 10:52 AM Graeme ORN wrote: Reason for CRM:  Patient called to check on imaging results - CT. Let pt know have not yet finalized. Patient would like a call back with results once reviewed. Thank You

## 2024-04-07 NOTE — Telephone Encounter (Signed)
 Called radiology and asked that the CT be read. Waiting on results

## 2024-04-10 LAB — COLOGUARD: COLOGUARD: NEGATIVE

## 2024-04-11 NOTE — Progress Notes (Signed)
 Results thru my chart

## 2024-04-11 NOTE — Telephone Encounter (Signed)
-----   Message from Ludie Gent sent at 04/11/2024  6:50 AM EDT ----- Results thru my chart ----- Message ----- From: Rebecka Memos Lab Results In Sent: 03/25/2024   8:40 PM EDT To: Alm GORMAN Gent, PA-C

## 2024-04-11 NOTE — Telephone Encounter (Signed)
 Called pt and gave results

## 2024-04-19 ENCOUNTER — Other Ambulatory Visit: Payer: Self-pay | Admitting: Medical

## 2024-06-03 DIAGNOSIS — C44622 Squamous cell carcinoma of skin of right upper limb, including shoulder: Secondary | ICD-10-CM | POA: Diagnosis not present
